# Patient Record
Sex: Male | Born: 1950 | Race: White | Hispanic: No | Marital: Married | Smoking: Never smoker
Health system: Southern US, Community
[De-identification: ages and names within clinical notes are randomized; demographics above are authoritative.]

## PROBLEM LIST (undated history)

## (undated) DIAGNOSIS — I10 Essential (primary) hypertension: Secondary | ICD-10-CM

## (undated) DIAGNOSIS — M109 Gout, unspecified: Secondary | ICD-10-CM

## (undated) DIAGNOSIS — E7800 Pure hypercholesterolemia, unspecified: Secondary | ICD-10-CM

---

## 2021-02-25 ENCOUNTER — Encounter (HOSPITAL_BASED_OUTPATIENT_CLINIC_OR_DEPARTMENT_OTHER): Payer: Self-pay | Admitting: *Deleted

## 2021-02-25 ENCOUNTER — Other Ambulatory Visit: Payer: Self-pay

## 2021-02-25 ENCOUNTER — Emergency Department (HOSPITAL_BASED_OUTPATIENT_CLINIC_OR_DEPARTMENT_OTHER): Payer: Medicare Other

## 2021-02-25 ENCOUNTER — Emergency Department (HOSPITAL_BASED_OUTPATIENT_CLINIC_OR_DEPARTMENT_OTHER)
Admission: EM | Admit: 2021-02-25 | Discharge: 2021-02-25 | Disposition: A | Discharge status: Home / Self Care | Payer: Medicare Other | Attending: Emergency Medicine | Admitting: Emergency Medicine

## 2021-02-25 DIAGNOSIS — S0990XA Unspecified injury of head, initial encounter: Secondary | ICD-10-CM

## 2021-02-25 DIAGNOSIS — Y9289 Other specified places as the place of occurrence of the external cause: Secondary | ICD-10-CM | POA: Diagnosis not present

## 2021-02-25 DIAGNOSIS — W228XXA Striking against or struck by other objects, initial encounter: Secondary | ICD-10-CM | POA: Diagnosis not present

## 2021-02-25 DIAGNOSIS — Z23 Encounter for immunization: Secondary | ICD-10-CM | POA: Diagnosis not present

## 2021-02-25 DIAGNOSIS — I10 Essential (primary) hypertension: Secondary | ICD-10-CM | POA: Insufficient documentation

## 2021-02-25 DIAGNOSIS — Y939 Activity, unspecified: Secondary | ICD-10-CM | POA: Insufficient documentation

## 2021-02-25 DIAGNOSIS — S0181XA Laceration without foreign body of other part of head, initial encounter: Secondary | ICD-10-CM | POA: Insufficient documentation

## 2021-02-25 DIAGNOSIS — Y990 Civilian activity done for income or pay: Secondary | ICD-10-CM | POA: Diagnosis not present

## 2021-02-25 HISTORY — DX: Essential (primary) hypertension: I10

## 2021-02-25 HISTORY — DX: Gout, unspecified: M10.9

## 2021-02-25 HISTORY — DX: Pure hypercholesterolemia, unspecified: E78.00

## 2021-02-25 MED ORDER — TETANUS-DIPHTH-ACELL PERTUSSIS 5-2.5-18.5 LF-MCG/0.5 IM SUSY
0.5000 mL | PREFILLED_SYRINGE | Freq: Once | INTRAMUSCULAR | Status: AC
  Administered 2021-02-25: 0.5 mL via INTRAMUSCULAR
  Filled 2021-02-25: qty 0.5

## 2021-02-25 NOTE — Discharge Instructions (Addendum)
You were seen in the emergency department today for a laceration. Your laceration was closed with skin glue. Please keep this area clean and dry for the next 24 hours, after 24 hours you may get this area wet, but avoid soaking the area. Keep the area covered as best possible especially when in the sun to help in minimizing scarring.   Your tetanus has been updated  The skin glue will come off on its own over time.  Your head CT did not show any brain bleeding.   Please follow up with primary care for general recheck in 1 week. Return to the ER soon should you start to experience pus type drainage from the wound, redness around the wound, or fevers as this could indicate the area is infected, vomiting, confusion, numbness, weakness, new or sudden change in headache, neck/back pain, seizure activity or any other concerns.

## 2021-02-25 NOTE — ED Triage Notes (Addendum)
C/o head injury x 1 hr ago hitting head on metal door, lac to forehead

## 2021-02-25 NOTE — ED Provider Notes (Signed)
[facility] EMERGENCY DEPARTMENT Provider Note   CSN: [id number] Arrival date & time: 02/25/21  1419     History Chief Complaint  Patient presents with   Head Injury    Jason Escobar is a 70 y.o. male with a hx of hypertension, hypercholesterolemia, and gout who presents to the ED for evaluation of head injury that occurred 1 hour PTA. Patient reports that while working today he was outside, turned his head and an individual was lifting their hatchback and it struck him in the head. He briefly may have blacked out for a second, but did not fall to the ground or have any additional injuries. Did have some nausea which has since resolved. Given his wound he went to the office medical staff to get a bandaid and was referred to the ED. States the area is sore, no alleviating/aggravating factors. Denies anticoagulation, visual disturbance, numbness, weakness, neck pain, back pain, or any other areas of injury. Last tetanus 2013.   HPI     Past Medical History:  Diagnosis Date   Gout    Hypercholesteremia    Hypertension     There are no problems to display for this patient.   History reviewed. No pertinent surgical history.     History reviewed. No pertinent family history.  Social History   Tobacco Use   Smoking status: Never   Smokeless tobacco: Never  Substance Use Topics   Alcohol use: Not Currently   Drug use: Not Currently    Home Medications Prior to Admission medications   Not on File    Allergies    Iodinated diagnostic agents and Penicillins  Review of Systems   Review of Systems  Constitutional:  Negative for chills and fever.  Respiratory:  Negative for shortness of breath.   Cardiovascular:  Negative for chest pain.  Gastrointestinal:  Positive for nausea (resolved). Negative for abdominal pain and vomiting.  Neurological:  Positive for headaches. Negative for seizures, weakness and numbness.  All other systems reviewed and are  negative.  Physical Exam Updated Vital Signs BP (!) 155/83 (BP Location: Right Arm)   Pulse 83   Temp 98.5 F (36.9 C) (Oral)   Resp 16   Ht 6\' 1"  (1.854 m)   Wt 113.4 kg   SpO2 94%   BMI 32.98 kg/m   Physical Exam Vitals and nursing note reviewed.  Constitutional:      General: He is not in acute distress.    Appearance: He is well-developed. He is not toxic-appearing.  HENT:     Head: Normocephalic.     Comments: 3.5 cm linear laceration to the forehead just inferior to the hairline. 1-2 mm deep. No significant SQ tissue exposure. No active bleeding. No hematoma. No racoon eyes or battle sign.     Ears:     Comments: No hemotympanum.  Eyes:     General:        Right eye: No discharge.        Left eye: No discharge.     Extraocular Movements: Extraocular movements intact.     Conjunctiva/sclera: Conjunctivae normal.     Pupils: Pupils are equal, round, and reactive to light.  Cardiovascular:     Rate and Rhythm: Normal rate and regular rhythm.  Pulmonary:     Effort: Pulmonary effort is normal. No respiratory distress.     Breath sounds: Normal breath sounds. No wheezing, rhonchi or rales.  Abdominal:     General: There is no distension.  Palpations: Abdomen is soft.     Tenderness: There is no abdominal tenderness.  Musculoskeletal:     Cervical back: Normal range of motion and neck supple. No tenderness.  Skin:    General: Skin is warm and dry.     Findings: No rash.  Neurological:     Mental Status: He is alert.     Comments: Alert. Clear speech. Cn III-XII grossly intact. Sensation grossly intact x 4. 5/5 symmetric grip strength and strength with plantar/dorsiflexion bilaterally. Ambulatory.   Psychiatric:        Behavior: Behavior normal.    ED Results / Procedures / Treatments   Labs (all labs ordered are listed, but only abnormal results are displayed) Labs Reviewed - No data to display  EKG None  Radiology CT Head Wo Contrast  Result Date:  02/25/2021 CLINICAL DATA:  Head trauma EXAM: CT HEAD WITHOUT CONTRAST TECHNIQUE: Contiguous axial images were obtained from the base of the skull through the vertex without intravenous contrast. COMPARISON:  None. FINDINGS: Brain: No acute territorial infarction, hemorrhage or intracranial mass. Mild atrophy. The ventricles are nonenlarged Vascular: No hyperdense vessel or unexpected calcification. Skull: Normal. Negative for fracture or focal lesion. Sinuses/Orbits: No acute finding. Other: None IMPRESSION: Negative non contrasted CT appearance of the brain for age Electronically Signed   By: Jasmine Pang M.D.   On: 02/25/2021 15:47    Procedures .Marland KitchenLaceration Repair  Date/Time: 02/25/2021 4:31 PM Performed by: Cherly Anderson, PA-C Authorized by: Cherly Anderson, PA-C   Consent:    Consent obtained:  Verbal   Consent given by:  Patient   Risks, benefits, and alternatives were discussed: yes     Alternatives discussed:  No treatment Anesthesia:    Anesthesia method:  None Laceration details:    Location:  Face   Face location:  Forehead   Length (cm):  3.5   Depth (mm):  1 Exploration:    Contaminated: no   Treatment:    Area cleansed with:  Povidone-iodine   Amount of cleaning:  Standard   Irrigation solution:  Sterile water Skin repair:    Repair method:  Tissue adhesive Approximation:    Approximation:  Close Repair type:    Repair type:  Simple Post-procedure details:    Procedure completion:  Tolerated well, no immediate complications   Medications Ordered in ED Medications  Tdap (BOOSTRIX) injection 0.5 mL (has no administration in time range)    ED Course  I have reviewed the triage vital signs and the nursing notes.  Pertinent labs & imaging results that were available during my care of the patient were reviewed by me and considered in my medical decision making (see chart for details).    MDM Rules/Calculators/A&P                           Patient presents to the ED for evaluation of head injury. Nontoxic, bp mildly elevated. Laceration present to forehead just inferior to the hairline. Superficial, cleansed, no visible FB, repaired per procedure note above. Tetanus updated. No midline spinal tenderness and no neuro deficits- do not suspect spinal injury at this time. Will obtain head CT given brief blacking out episode in patient > 80 yo following head injury.   Additional history obtained:  Additional history obtained from chart review & nursing note review.   Imaging Studies ordered:  I ordered imaging studies which included CT head wo contrast, I independently reviewed, formal  radiology impression shows:  Negative non contrasted CT appearance of the brain for age  ED Course:  Head CT reassuring. Laceration repaired as above. Patient appears appropriate for discharge home.   I discussed results, treatment plan, need for follow-up, and return precautions with the patient. Provided opportunity for questions, patient confirmed understanding and is in agreement with plan.   Portions of this note were generated with Scientist, clinical (histocompatibility and immunogenetics). Dictation errors may occur despite best attempts at proofreading.   Final Clinical Impression(s) / ED Diagnoses Final diagnoses:  Injury of head, initial encounter  Laceration of forehead, initial encounter    Rx / DC Orders ED Discharge Orders     None        Desmond Lope 02/25/21 1636    Tilden Fossa, MD 02/25/21 2348

## 2023-05-25 IMAGING — CT CT HEAD W/O CM
3 series · 14 of 47 positions shown, 16 images · non-contrast
Comparison: None.

CLINICAL DATA: Head trauma

EXAM:
CT HEAD WITHOUT CONTRAST
TECHNIQUE: Contiguous axial images were obtained from the base of the skull
through the vertex without intravenous contrast.

[Series 2: head wo · axial · 0.46mm/px · z∈[+1179,+1314]mm · 8 of 33 slices shown, 10 images]
[im 3/33  brain]
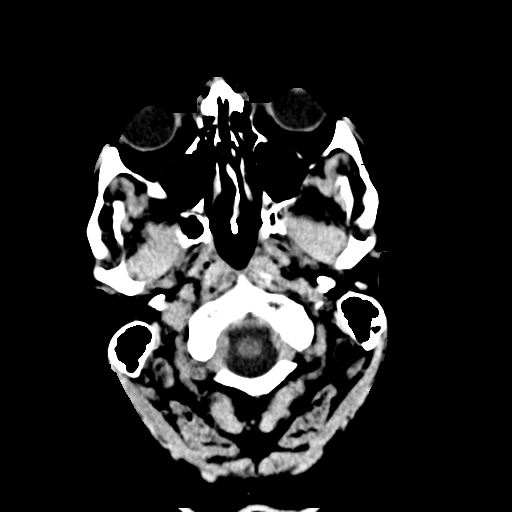
[im 3/33  bone]
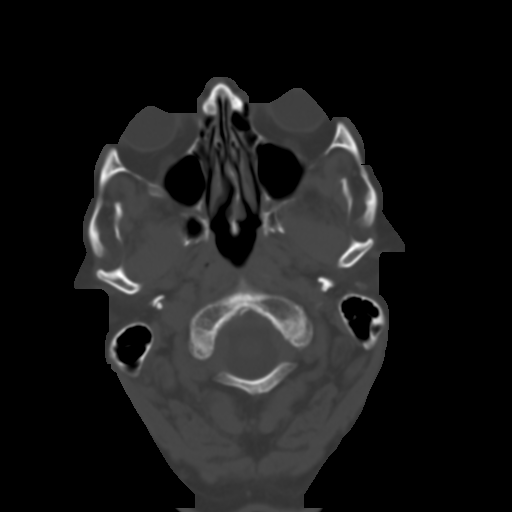
[im 7/33  brain]
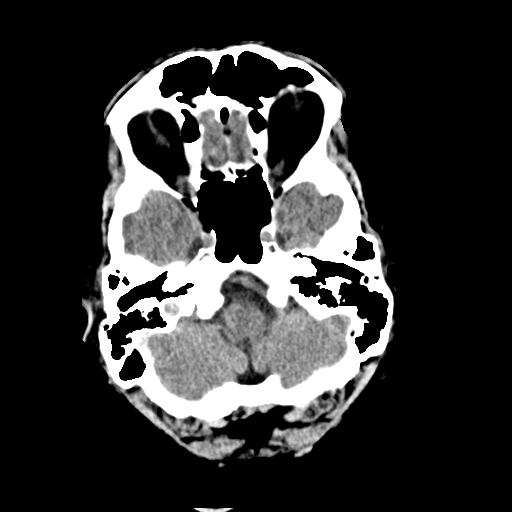
[im 10/33  brain]
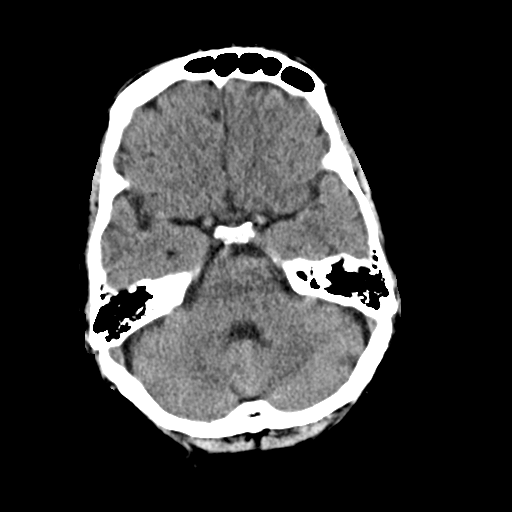
[im 15/33  brain]
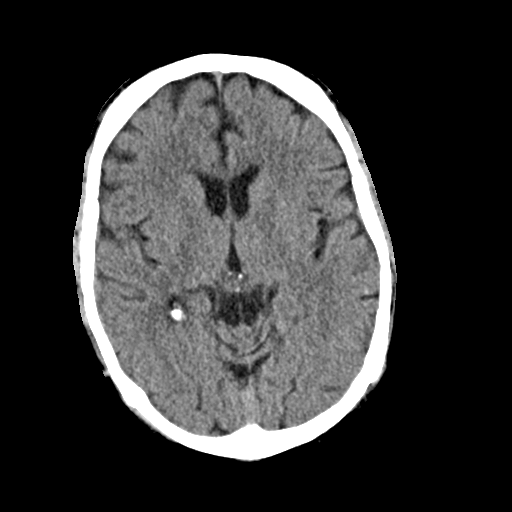
[im 18/33  brain]
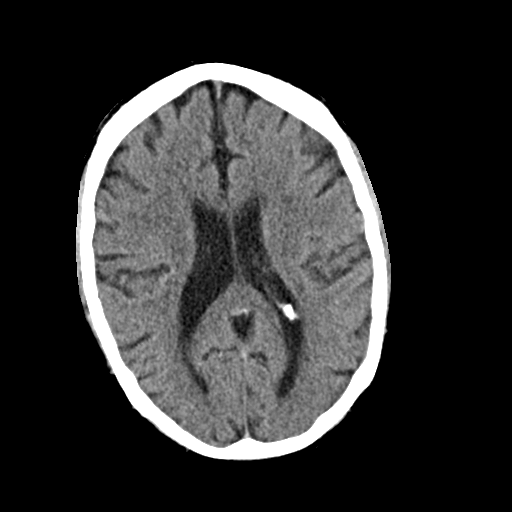
[im 18/33  bone]
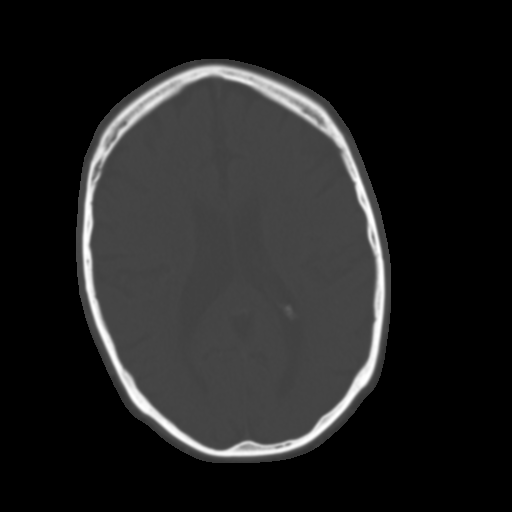
[im 23/33  brain]
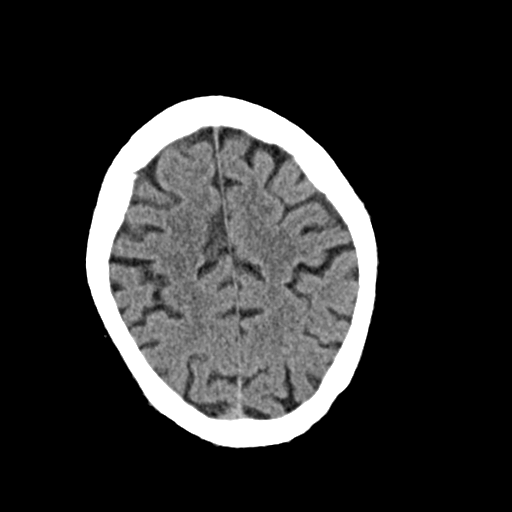
[im 26/33  brain]
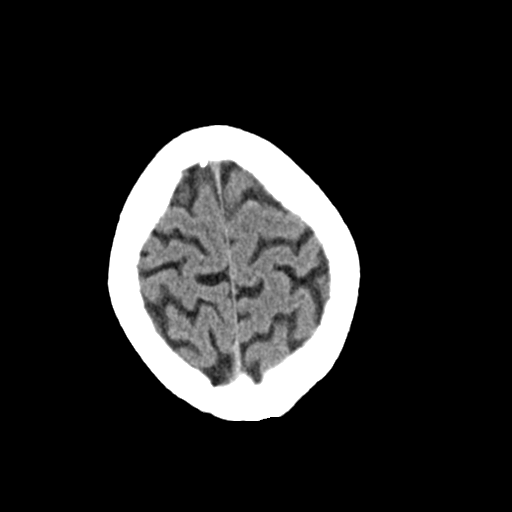
[im 30/33  brain]
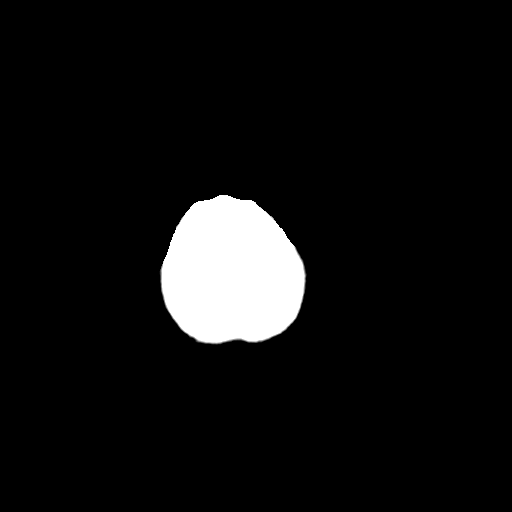

[Series 4: coronal soft · coronal · 0.32mm/px · 3 of 70 slices shown]
[im 24/70  brain]
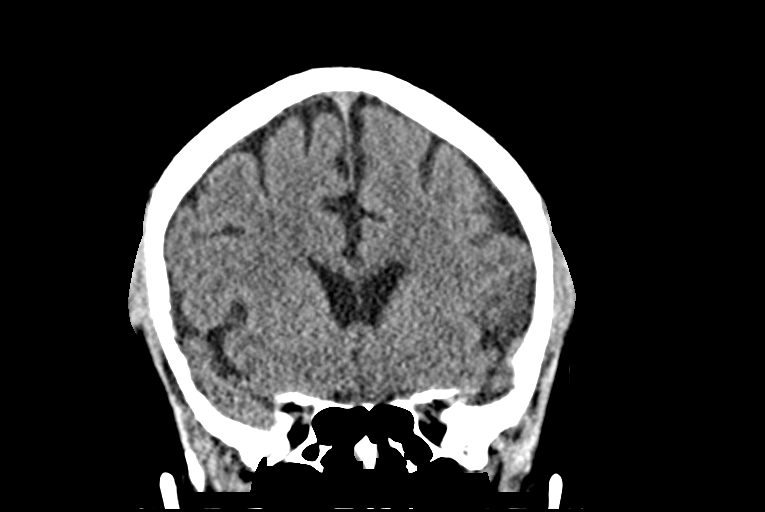
[im 31/70  brain]
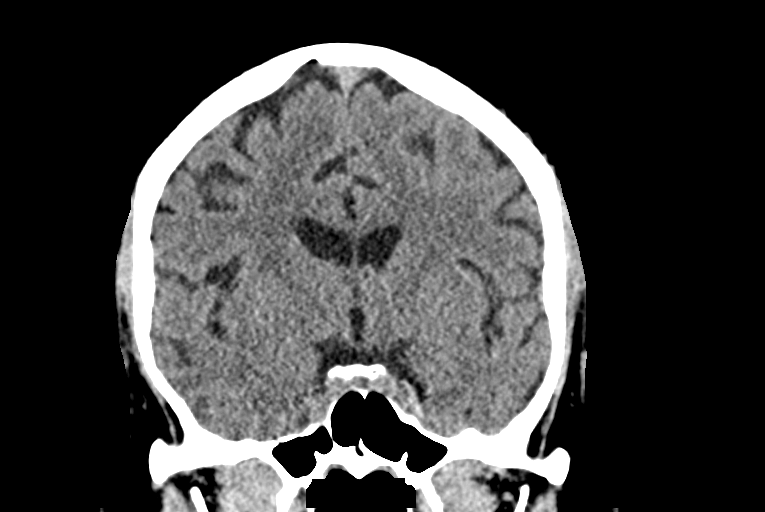
[im 39/70  brain]
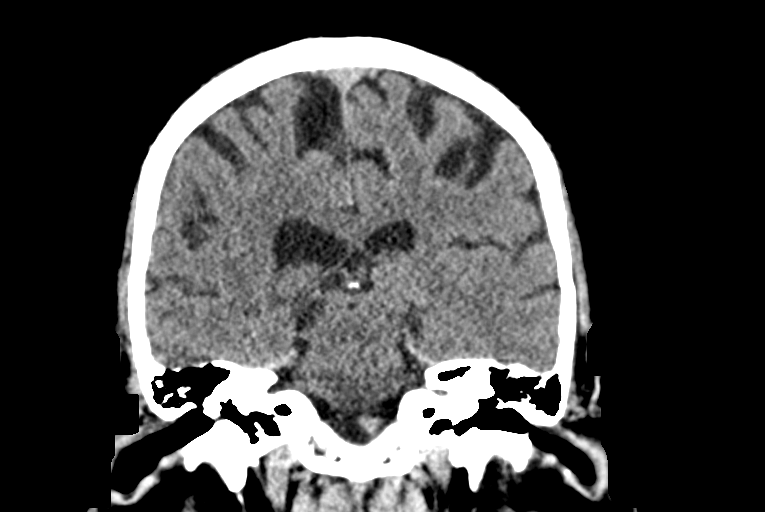

[Series 5: sag soft · sagittal · 0.32mm/px · 3 of 67 slices shown]
[im 23/67  brain]
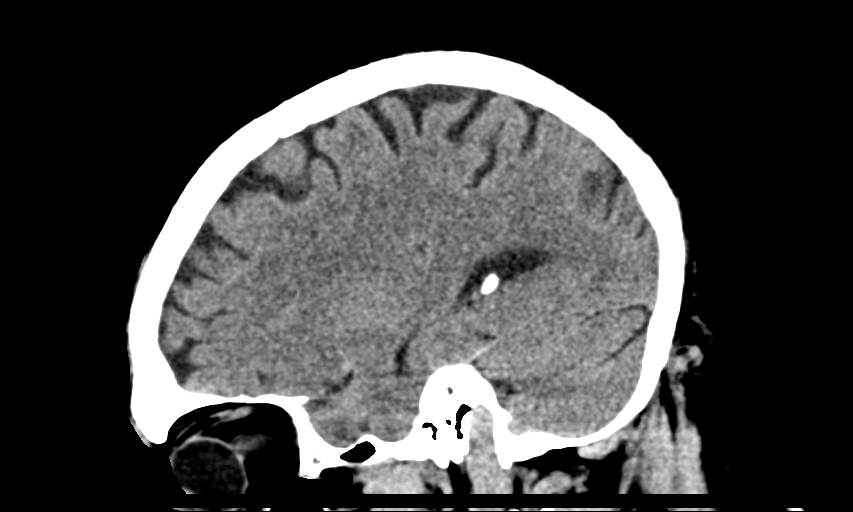
[im 34/67  brain]
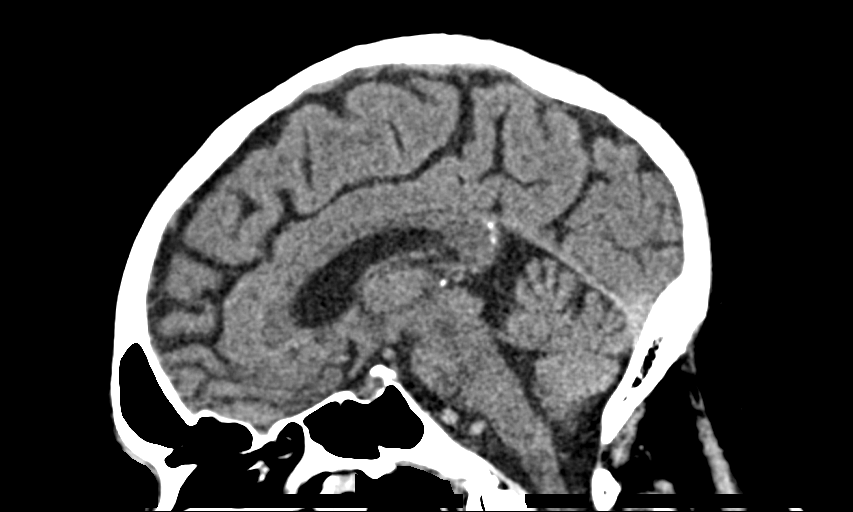
[im 45/67  brain]
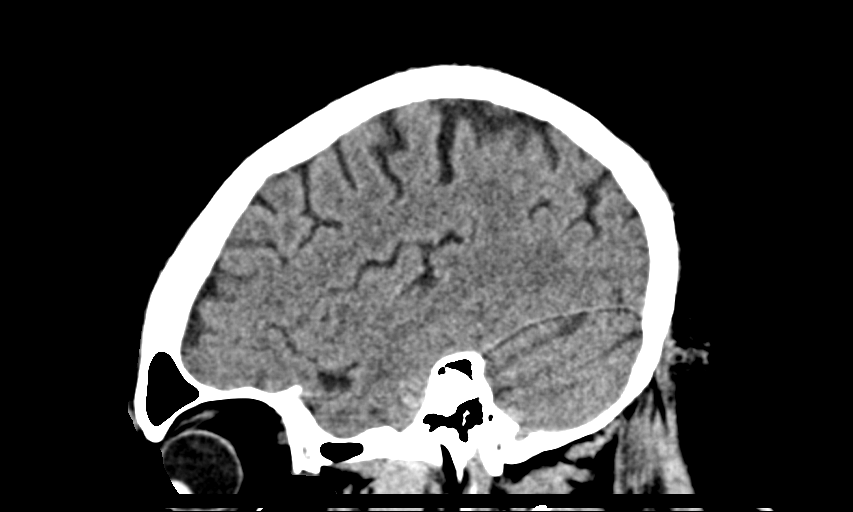

[14 of 47 positions shown; findings below may reference images not displayed]

FINDINGS: Brain: No acute territorial infarction, hemorrhage or intracranial
mass. Mild atrophy. The ventricles are nonenlarged

Vascular: No hyperdense vessel or unexpected calcification.

Skull: Normal. Negative for fracture or focal lesion.

Sinuses/Orbits: No acute finding.

Other: None
IMPRESSION: Negative non contrasted CT appearance of the brain for age

## 2023-06-01 ENCOUNTER — Other Ambulatory Visit: Payer: Self-pay

## 2023-06-01 ENCOUNTER — Inpatient Hospital Stay (HOSPITAL_BASED_OUTPATIENT_CLINIC_OR_DEPARTMENT_OTHER)
Admission: EM | Admit: 2023-06-01 | Discharge: 2023-06-03 | DRG: 243 | Disposition: A | Discharge status: Home / Self Care | Payer: Medicare Other | Attending: Cardiology | Admitting: Cardiology

## 2023-06-01 ENCOUNTER — Emergency Department (HOSPITAL_BASED_OUTPATIENT_CLINIC_OR_DEPARTMENT_OTHER): Payer: Medicare Other

## 2023-06-01 ENCOUNTER — Encounter (HOSPITAL_BASED_OUTPATIENT_CLINIC_OR_DEPARTMENT_OTHER): Payer: Self-pay

## 2023-06-01 DIAGNOSIS — K219 Gastro-esophageal reflux disease without esophagitis: Secondary | ICD-10-CM | POA: Diagnosis present

## 2023-06-01 DIAGNOSIS — Z8249 Family history of ischemic heart disease and other diseases of the circulatory system: Secondary | ICD-10-CM

## 2023-06-01 DIAGNOSIS — Z79899 Other long term (current) drug therapy: Secondary | ICD-10-CM

## 2023-06-01 DIAGNOSIS — Z880 Allergy status to penicillin: Secondary | ICD-10-CM | POA: Diagnosis not present

## 2023-06-01 DIAGNOSIS — R739 Hyperglycemia, unspecified: Secondary | ICD-10-CM | POA: Diagnosis present

## 2023-06-01 DIAGNOSIS — F32A Depression, unspecified: Secondary | ICD-10-CM | POA: Diagnosis present

## 2023-06-01 DIAGNOSIS — R001 Bradycardia, unspecified: Secondary | ICD-10-CM

## 2023-06-01 DIAGNOSIS — M109 Gout, unspecified: Secondary | ICD-10-CM | POA: Diagnosis present

## 2023-06-01 DIAGNOSIS — E7800 Pure hypercholesterolemia, unspecified: Secondary | ICD-10-CM | POA: Diagnosis present

## 2023-06-01 DIAGNOSIS — N170 Acute kidney failure with tubular necrosis: Secondary | ICD-10-CM

## 2023-06-01 DIAGNOSIS — I441 Atrioventricular block, second degree: Secondary | ICD-10-CM | POA: Diagnosis present

## 2023-06-01 DIAGNOSIS — I442 Atrioventricular block, complete: Secondary | ICD-10-CM | POA: Diagnosis not present

## 2023-06-01 DIAGNOSIS — I10 Essential (primary) hypertension: Secondary | ICD-10-CM

## 2023-06-01 DIAGNOSIS — N179 Acute kidney failure, unspecified: Secondary | ICD-10-CM | POA: Diagnosis present

## 2023-06-01 DIAGNOSIS — Z91041 Radiographic dye allergy status: Secondary | ICD-10-CM

## 2023-06-01 DIAGNOSIS — R42 Dizziness and giddiness: Secondary | ICD-10-CM | POA: Diagnosis not present

## 2023-06-01 LAB — BASIC METABOLIC PANEL
Anion gap: 13 (ref 5–15)
BUN: 26 mg/dL — ABNORMAL HIGH (ref 8–23)
CO2: 23 mmol/L (ref 22–32)
Calcium: 9.6 mg/dL (ref 8.9–10.3)
Chloride: 104 mmol/L (ref 98–111)
Creatinine, Ser: 1.32 mg/dL — ABNORMAL HIGH (ref 0.61–1.24)
GFR, Estimated: 58 mL/min — ABNORMAL LOW (ref >60)
Glucose, Bld: 159 mg/dL — ABNORMAL HIGH (ref 70–99)
Potassium: 3.7 mmol/L (ref 3.5–5.1)
Sodium: 140 mmol/L (ref 135–145)

## 2023-06-01 LAB — TROPONIN I (HIGH SENSITIVITY)
Troponin I (High Sensitivity): 7 ng/L (ref <18)
Troponin I (High Sensitivity): 7 ng/L (ref <18)

## 2023-06-01 LAB — CBC
HCT: 42.5 % (ref 39.0–52.0)
Hemoglobin: 14.3 g/dL (ref 13.0–17.0)
MCH: 29.9 pg (ref 26.0–34.0)
MCHC: 33.6 g/dL (ref 30.0–36.0)
MCV: 88.7 fL (ref 80.0–100.0)
Platelets: 196 10*3/uL (ref 150–400)
RBC: 4.79 MIL/uL (ref 4.22–5.81)
RDW: 14.1 % (ref 11.5–15.5)
WBC: 9.1 10*3/uL (ref 4.0–10.5)
nRBC: 0 % (ref 0.0–0.2)

## 2023-06-01 LAB — SURGICAL PCR SCREEN
MRSA, PCR: NEGATIVE
Staphylococcus aureus: POSITIVE — AB

## 2023-06-01 MED ORDER — HEPARIN SODIUM (PORCINE) 5000 UNIT/ML IJ SOLN
5000.0000 [IU] | Freq: Three times a day (TID) | INTRAMUSCULAR | Status: DC
  Administered 2023-06-01 – 2023-06-02 (×2): 5000 [IU] via SUBCUTANEOUS
  Filled 2023-06-01 (×2): qty 1

## 2023-06-01 MED ORDER — ACETAMINOPHEN 325 MG PO TABS
650.0000 mg | ORAL_TABLET | ORAL | Status: DC | PRN
  Administered 2023-06-02 – 2023-06-03 (×2): 650 mg via ORAL
  Filled 2023-06-01 (×2): qty 2

## 2023-06-01 MED ORDER — ROSUVASTATIN CALCIUM 20 MG PO TABS
20.0000 mg | ORAL_TABLET | Freq: Every evening | ORAL | Status: DC
  Administered 2023-06-01 – 2023-06-02 (×2): 20 mg via ORAL
  Filled 2023-06-01 (×2): qty 1

## 2023-06-01 MED ORDER — CHLORHEXIDINE GLUCONATE CLOTH 2 % EX PADS
6.0000 | MEDICATED_PAD | Freq: Every day | CUTANEOUS | Status: DC
  Administered 2023-06-02 – 2023-06-03 (×2): 6 via TOPICAL

## 2023-06-01 MED ORDER — ONDANSETRON HCL 4 MG/2ML IJ SOLN: 4.0000 mg | Freq: Four times a day (QID) | INTRAMUSCULAR | Status: DC | PRN

## 2023-06-01 MED ORDER — MUPIROCIN 2 % EX OINT
1.0000 | TOPICAL_OINTMENT | Freq: Two times a day (BID) | CUTANEOUS | Status: DC
  Administered 2023-06-01 – 2023-06-03 (×4): 1 via NASAL
  Filled 2023-06-01 (×2): qty 22

## 2023-06-01 MED ORDER — ESCITALOPRAM OXALATE 10 MG PO TABS
10.0000 mg | ORAL_TABLET | Freq: Every day | ORAL | Status: DC
  Administered 2023-06-02 – 2023-06-03 (×2): 10 mg via ORAL
  Filled 2023-06-01 (×2): qty 1

## 2023-06-01 NOTE — ED Triage Notes (Signed)
C/o lightheadedness/weakness, worse with exertion starting Monday. Went to PCP and had abnormal EKG. Denies chest pain/SOB

## 2023-06-01 NOTE — ED Notes (Signed)
Consult to cardiology called for, spoke with Maisie Fus at [facility]

## 2023-06-01 NOTE — H&P (Signed)
Cardiology Admission History and Physical   Patient ID: Jason Escobar MRN: [id number]; DOB: 1950-09-07   Admission date: 06/01/2023  PCP:  Jason Escobar., MD   [facility] Providers Cardiologist:  New  Chief Complaint:  Dizziness/2:1 AVB  Patient Profile:   Jason Escobar is a 72 y.o. male with hypertension, hyperlipidemia, GERD, depression who is being seen 06/01/2023 for the evaluation of dizziness, heart block.  History of Present Illness:   Jason Escobar is a 72 year old male with past medical history noted above. He has not been seen by cardiology in the past, follows with PCP on a regular basis. 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 He and his wife are currently building a home in [place]. Reports he has been in his usual state of health until Monday. Reports he was outside working in the yard and felt weak, dizzy. Noted he was unable to do his usual activity. Decided to get on his tractor and was able to complete the yard work. Continued to feel weak and fatigued into Tuesday but has evening began to feel more "like himself". This morning he was up getting ready for work, putting on his uniform and gun belt and he felt light-headed. Became concerned and presented to his PCP office. At the office he was noted to 2nd degree AVB and sent to the ED for further evaluation.   In the ED his labs showed Na+ 140, K+ 3.7, Cr 1.32, hsTn 7>>7, WBC 9.1, Hgb 14.3. CXR negative. EKG appears to be more consistent with CHB, 40 bpm. Case was discussed with cardiology and transferred to [facility] for further evaluation.    On arrival, he is alert and oriented. BP are stable. He is not on any AV nodal agents prior to admission.   Past Medical History:  Diagnosis Date   Gout    Hypercholesteremia    Hypertension     History reviewed. No pertinent surgical history.   Medications Prior to Admission: Prior to Admission medications    Medication Sig Start Date End Date Taking? Authorizing Provider  allopurinol (ZYLOPRIM) 300 MG tablet Take 300 mg by mouth daily. 12/18/14  Yes [provider]  aspirin EC 81 MG tablet Take 81 mg by mouth at bedtime. 01/04/17  Yes [provider]  Blood Glucose Monitoring Suppl (TGT BLOOD GLUCOSE MONITORING) w/Device KIT Use to check blood sugar once a day  DX E11.69 10/03/18  Yes [provider]  cetirizine (ZYRTEC) 10 MG tablet Take 10 mg by mouth at bedtime.   Yes [provider]  Cholecalciferol (VITAMIN D3) 20 MCG (800 UNIT) TABS Take 20 mcg by mouth at bedtime.   Yes [provider]  cyanocobalamin (VITAMIN B12) 1000 MCG tablet Take 1,000 mcg by mouth every evening. 10/06/15  Yes [provider]  escitalopram (LEXAPRO) 10 MG tablet Take 10 mg by mouth daily. 11/16/21  Yes [provider]  fluticasone (FLONASE) 50 MCG/ACT nasal spray Place 2 sprays into both nostrils as needed for allergies. 05/21/19  Yes [provider]  glucosamine-chondroitin 500-400 MG tablet Take 1 tablet by mouth 2 (two) times daily.   Yes [provider]  lisinopril-hydrochlorothiazide (ZESTORETIC) 10-12.5 MG tablet Take 1 tablet by mouth daily. 12/25/14  Yes [provider]  Multiple Vitamins-Minerals (ZINC PO) Take 1 tablet by mouth daily.   Yes [provider]  Omega-3 1000 MG CAPS Take 1 capsule by mouth 2 (two) times  daily.   Yes [provider]  omeprazole (PRILOSEC) 20 MG capsule Take 20 mg by mouth daily. 05/15/23  Yes [provider]  rosuvastatin (CRESTOR) 20 MG tablet Take 20 mg by mouth every evening. 02/11/22  Yes [provider]     Allergies:    Allergies  Allergen Reactions   Iodinated Contrast Media Hives and Rash   Penicillins Hives    Social History:   Social History   Socioeconomic History   Marital status: Married    Spouse name: Not on file   Number of children: Not on  file   Years of education: Not on file   Highest education level: Not on file  Occupational History   Not on file  Tobacco Use   Smoking status: Never   Smokeless tobacco: Never  Vaping Use   Vaping status: Never Used  Substance and Sexual Activity   Alcohol use: Not Currently   Drug use: Not Currently   Sexual activity: Not on file  Other Topics Concern   Not on file  Social History Narrative   Not on file   Social Determinants of Health   Financial Resource Strain: Not on file  Food Insecurity: Low Risk  (05/31/2023)   Received from [facility]   Hunger Vital Sign    Worried About Running Out of Food in the Last Year: Never true    Ran Out of Food in the Last Year: Never true  Transportation Needs: No Transportation Needs (05/31/2023)   Received from [facility]    In the past 12 months, has lack of reliable transportation kept you from medical appointments, meetings, work or from getting things needed for daily living? : No  Physical Activity: Not on file  Stress: Not on file  Social Connections: Not on file  Intimate Partner Violence: Not on file    Family History:   The patient's family history includes Hypertension in his father.    ROS:  Please see the history of present illness.  All other ROS reviewed and negative.     Physical Exam/Data:   Vitals:   06/01/23 1345 06/01/23 1400 06/01/23 1415 06/01/23 1700  BP:  (!) 146/70  (!) 155/74  Pulse: (!) 40 (!) 38 (!) 39 (!) 43  Resp: 18 12 14 14   Temp:    97.6 F (36.4 C)  TempSrc:    Oral  SpO2: 96% 98% 100% 97%  Weight:      Height:       No intake or output data in the 24 hours ending 06/01/23 1801    06/01/2023   12:32 PM 02/25/2021    2:26 PM  Last 3 Weights  Weight (lbs) 243 lb 250 lb  Weight (kg) 110.224 kg 113.399 kg     Body mass index is 32.06 kg/m.  General:  Well nourished, well developed, in no acute distress HEENT: normal Neck: no JVD Vascular: No carotid  bruits; Distal pulses 2+ bilaterally   Cardiac:  normal S1, S2; RRR; no murmur  Lungs:  clear to auscultation bilaterally, no wheezing, rhonchi or rales  Abd: soft, nontender, no hepatomegaly  Ext: no edema Musculoskeletal:  No deformities, BUE and BLE strength normal and equal Skin: warm and dry  Neuro:  CNs 2-12 intact, no focal abnormalities noted Psych:  Normal affect    EKG:  The ECG that was done 10/23 was personally reviewed and demonstrates high degree AVB 40 bpm  Relevant CV Studies:  N/a   Laboratory Data:  High Sensitivity Troponin:   Recent Labs  Lab 06/01/23 1236 06/01/23 1436  TROPONINIHS 7 7      Chemistry Recent Labs  Lab 06/01/23 1236  NA 140  K 3.7  CL 104  CO2 23  GLUCOSE 159*  BUN 26*  CREATININE 1.32*  CALCIUM 9.6  GFRNONAA 58*  ANIONGAP 13    No results for input(s): "PROT", "ALBUMIN", "AST", "ALT", "ALKPHOS", "BILITOT" in the last 168 hours. Lipids No results for input(s): "CHOL", "TRIG", "HDL", "LABVLDL", "LDLCALC", "CHOLHDL" in the last 168 hours. Hematology Recent Labs  Lab 06/01/23 1236  WBC 9.1  RBC 4.79  HGB 14.3  HCT 42.5  MCV 88.7  MCH 29.9  MCHC 33.6  RDW 14.1  PLT 196   Thyroid No results for input(s): "TSH", "FREET4" in the last 168 hours. BNPNo results for input(s): "BNP", "PROBNP" in the last 168 hours.  DDimer No results for input(s): "DDIMER" in the last 168 hours.   Radiology/Studies:  DG Chest Port 1 View  Result Date: 06/01/2023 CLINICAL DATA:  Weakness and lightheadedness. EXAM: PORTABLE CHEST 1 VIEW COMPARISON:  02/08/2020 FINDINGS: Stable heart size and mediastinal contours. Chronic elevation of right hemidiaphragm. No focal airspace disease, pulmonary edema, large pleural effusion or pneumothorax. Multiple overlying monitoring devices. IMPRESSION: No active disease. Electronically Signed   By: Narda Rutherford M.D.   On: 06/01/2023 15:37     Assessment and Plan:   Ona Sedgwick is a 72 y.o. male with  hypertension, hyperlipidemia, GERD, depression who is being seen 06/01/2023 for the evaluation of dizziness, heart block.  High degree AVB -- he developed symptoms on Monday that lingering into this morning when he presented to his PCP office. EKG there was reported a 2:1 AVB. EKG in the ED appears more consistent with CHB. Bp is stable, and he is asymptomatic at the present. No AVN agents pta -- check echo -- NPO at midnight in anticipation of PPM placement  HTN -- BP stable, will hold home meds for now  AKI -- Cr 1.32, as above holding BP meds -- recheck BMET in am  Code Status: Full Code  Severity of Illness: The appropriate patient status for this patient is INPATIENT. Inpatient status is judged to be reasonable and necessary in order to provide the required intensity of service to ensure the patient's safety. The patient's presenting symptoms, physical exam findings, and initial radiographic and laboratory data in the context of their chronic comorbidities is felt to place them at high risk for further clinical deterioration. Furthermore, it is not anticipated that the patient will be medically stable for discharge from the hospital within 2 midnights of admission.   * I certify that at the point of admission it is my clinical judgment that the patient will require inpatient hospital care spanning beyond 2 midnights from the point of admission due to high intensity of service, high risk for further deterioration and high frequency of surveillance required.*   For questions or updates, please contact [facility] Please consult [email or web address] for contact info under     Signed, Jason Page, NP  06/01/2023 6:01 PM

## 2023-06-01 NOTE — Plan of Care (Signed)

## 2023-06-01 NOTE — ED Provider Notes (Signed)
[place] [facility] AT [facility] [place] Provider Note   CSN: [id number] Arrival date & time: 06/01/23  1225     History  Chief Complaint  Patient presents with   Dizziness    Jason Escobar is a 72 y.o. male.  72 year old male with a history of hypertension and hyperlipidemia who presents to the emergency department with dizziness.  Patient reports that over the past 3 days has been lightheaded and very fatigued with exertion.  Has not actually lost consciousness.  No chest pain at all.  No history of arrhythmia.  Went to his primary doctor and was found to be in a 2-1 heart block and was referred to the emergency department.  Says that he does not have any known history of arrhythmia aside from a right bundle branch block.       Home Medications Prior to Admission medications   Medication Sig Start Date End Date Taking? Authorizing Provider  allopurinol (ZYLOPRIM) 300 MG tablet Take 300 mg by mouth daily. 12/18/14  Yes [provider]  aspirin EC 81 MG tablet Take 81 mg by mouth at bedtime. 01/04/17  Yes [provider]  Blood Glucose Monitoring Suppl (TGT BLOOD GLUCOSE MONITORING) w/Device KIT Use to check blood sugar once a day  DX E11.69 10/03/18  Yes [provider]  cetirizine (ZYRTEC) 10 MG tablet Take 10 mg by mouth at bedtime.   Yes [provider]  Cholecalciferol (VITAMIN D3) 20 MCG (800 UNIT) TABS Take 20 mcg by mouth at bedtime.   Yes [provider]  cyanocobalamin (VITAMIN B12) 1000 MCG tablet Take 1,000 mcg by mouth every evening. 10/06/15  Yes [provider]  escitalopram (LEXAPRO) 10 MG tablet Take 10 mg by mouth daily. 11/16/21  Yes [provider]  fluticasone (FLONASE) 50 MCG/ACT nasal spray Place 2 sprays into both nostrils as needed for allergies. 05/21/19  Yes [provider]  glucosamine-chondroitin 500-400 MG tablet Take 1 tablet by mouth 2 (two) times daily.   Yes  [provider]  lisinopril-hydrochlorothiazide (ZESTORETIC) 10-12.5 MG tablet Take 1 tablet by mouth daily. 12/25/14  Yes [provider]  Multiple Vitamins-Minerals (ZINC PO) Take 1 tablet by mouth daily.   Yes [provider]  Omega-3 1000 MG CAPS Take 1 capsule by mouth 2 (two) times daily.   Yes [provider]  omeprazole (PRILOSEC) 20 MG capsule Take 20 mg by mouth daily. 05/15/23  Yes [provider]  rosuvastatin (CRESTOR) 20 MG tablet Take 20 mg by mouth every evening. 02/11/22  Yes [provider]      Allergies    Iodinated contrast media and Penicillins    Review of Systems   Review of Systems  Physical Exam Updated Vital Signs BP (!) 146/70   Pulse (!) 39   Temp 98.2 F (36.8 C) (Oral)   Resp 14   Ht 6\' 1"  (1.854 m)   Wt 110.2 kg   SpO2 100%   BMI 32.06 kg/m  Physical Exam Vitals and nursing note reviewed.  Constitutional:      General: He is not in acute distress.    Appearance: He is well-developed.  HENT:     Head: Normocephalic and atraumatic.     Right Ear: External ear normal.     Left Ear: External ear normal.     Nose: Nose normal.  Eyes:     Extraocular Movements: Extraocular movements intact.     Conjunctiva/sclera: Conjunctivae normal.  Pupils: Pupils are equal, round, and reactive to light.  Cardiovascular:     Rate and Rhythm: Regular rhythm. Bradycardia present.     Heart sounds: Normal heart sounds.  Pulmonary:     Effort: Pulmonary effort is normal. No respiratory distress.  Musculoskeletal:     Cervical back: Normal range of motion and neck supple.     Right lower leg: Edema (Trace) present.     Left lower leg: Edema (Trace) present.  Skin:    General: Skin is warm and dry.  Neurological:     Mental Status: He is alert. Mental status is at baseline.  Psychiatric:        Mood and Affect: Mood normal.        Behavior: Behavior normal.     ED Results / Procedures / Treatments    Labs (all labs ordered are listed, but only abnormal results are displayed) Labs Reviewed  BASIC METABOLIC PANEL - Abnormal; Notable for the following components:      Result Value   Glucose, Bld 159 (*)    BUN 26 (*)    Creatinine, Ser 1.32 (*)    GFR, Estimated 58 (*)    All other components within normal limits  CBC  TROPONIN I (HIGH SENSITIVITY)  TROPONIN I (HIGH SENSITIVITY)    EKG EKG Interpretation Date/Time:  Wednesday June 01 2023 12:35:36 EDT Ventricular Rate:  40 PR Interval:    QRS Duration:  151 QT Interval:  603 QTC Calculation: 492 R Axis:   111  Text Interpretation: 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  Procedures Procedures    Medications Ordered in ED Medications - No data to display  ED Course/ Medical Decision Making/ A&P Clinical Course as of 06/01/23 1436  Wed Jun 01, 2023  1355 Dr Jacinto Halim from cardiology to admit the patient for pacemaker evaluation. [RP]    Clinical Course User Index [RP] Rondel Baton, MD                                 Medical Decision Making Amount and/or Complexity of Data Reviewed Labs: ordered. Radiology: ordered.  Risk Decision regarding hospitalization.   Jason Escobar is a 72 y.o. male with comorbidities that complicate the patient evaluation including hypertension and hyperlipidemia who presents emergency department with dizziness  Initial Ddx:  Arrhythmia, complete heart block, MI, anemia  MDM/Course:  Patient resents emergency department with generalized weakness and lightheadedness for the past 3 days.  Had an EKG prior to arrival that shows that he was in the 2-1 heart block.  Here does appear to be in complete heart block.  He is hemodynamically stable otherwise and mentating well so no indication for emergent transcutaneous pacing.  Had troponin that was WNL.  EKG without  ischemic changes.  Discussed with cardiology who will admit the patient for pacemaker evaluation.  Upon re-evaluation patient remained stable.  This patient presents to the ED for concern of complaints listed in HPI, this involves an extensive number of treatment options, and is a complaint that carries with it a high risk of complications and morbidity. Disposition including potential need for admission considered.   Dispo: Admit to Floor  Additional history obtained from spouse Records reviewed Outpatient Clinic Notes The following labs were independently interpreted: Chemistry and show  kidney injury  of unclear chronicity I independently reviewed the following imaging with scope of interpretation limited to determining acute life threatening conditions related to emergency care: Chest x-ray and agree with the radiologist interpretation with the following exceptions: none I personally reviewed and interpreted cardiac monitoring:  Complete heart block I personally reviewed and interpreted the pt's EKG: see above for interpretation  I have reviewed the patients home medications and made adjustments as needed Consults: Cardiology Social Determinants of health:  Elderly  Portions of this note were generated with Scientist, clinical (histocompatibility and immunogenetics). Dictation errors may occur despite best attempts at proofreading.    CRITICAL CARE Performed by: Rondel Baton   Total critical care time: 45 minutes  Critical care time was exclusive of separately billable procedures and treating other patients.  Critical care was necessary to treat or prevent imminent or life-threatening deterioration.  Critical care was time spent personally by me on the following activities: development of treatment plan with patient and/or surrogate as well as nursing, discussions with consultants, evaluation of patient's response to treatment, examination of patient, obtaining history from patient or surrogate, ordering and  performing treatments and interventions, ordering and review of laboratory studies, ordering and review of radiographic studies, pulse oximetry and re-evaluation of patient's condition.  Final Clinical Impression(s) / ED Diagnoses Final diagnoses:  Complete heart block (HCC)  Bradycardia  Lightheadedness    Rx / DC Orders ED Discharge Orders     None         Rondel Baton, MD 06/01/23 1436

## 2023-06-02 ENCOUNTER — Other Ambulatory Visit: Payer: Self-pay

## 2023-06-02 ENCOUNTER — Inpatient Hospital Stay (HOSPITAL_COMMUNITY)
Admission: EM | Disposition: A | Discharge status: Home / Self Care | Payer: Self-pay | Source: Home / Self Care | Attending: Cardiology

## 2023-06-02 ENCOUNTER — Inpatient Hospital Stay (HOSPITAL_COMMUNITY): Payer: Medicare Other

## 2023-06-02 DIAGNOSIS — I441 Atrioventricular block, second degree: Secondary | ICD-10-CM

## 2023-06-02 DIAGNOSIS — N170 Acute kidney failure with tubular necrosis: Secondary | ICD-10-CM

## 2023-06-02 HISTORY — PX: PACEMAKER IMPLANT: EP1218

## 2023-06-02 LAB — BASIC METABOLIC PANEL
Anion gap: 9 (ref 5–15)
BUN: 22 mg/dL (ref 8–23)
CO2: 23 mmol/L (ref 22–32)
Calcium: 9.2 mg/dL (ref 8.9–10.3)
Chloride: 107 mmol/L (ref 98–111)
Creatinine, Ser: 1.4 mg/dL — ABNORMAL HIGH (ref 0.61–1.24)
GFR, Estimated: 54 mL/min — ABNORMAL LOW (ref >60)
Glucose, Bld: 127 mg/dL — ABNORMAL HIGH (ref 70–99)
Potassium: 3.6 mmol/L (ref 3.5–5.1)
Sodium: 139 mmol/L (ref 135–145)

## 2023-06-02 LAB — CBC
HCT: 37.8 % — ABNORMAL LOW (ref 39.0–52.0)
Hemoglobin: 12.6 g/dL — ABNORMAL LOW (ref 13.0–17.0)
MCH: 29 pg (ref 26.0–34.0)
MCHC: 33.3 g/dL (ref 30.0–36.0)
MCV: 87.1 fL (ref 80.0–100.0)
Platelets: 176 10*3/uL (ref 150–400)
RBC: 4.34 MIL/uL (ref 4.22–5.81)
RDW: 14 % (ref 11.5–15.5)
WBC: 9.1 10*3/uL (ref 4.0–10.5)
nRBC: 0 % (ref 0.0–0.2)

## 2023-06-02 LAB — ECHOCARDIOGRAM COMPLETE
AR max vel: 3.15 cm2
AV Area VTI: 2.94 cm2
AV Area mean vel: 3.04 cm2
AV Mean grad: 7 mm[Hg]
AV Peak grad: 13.8 mm[Hg]
Ao pk vel: 1.86 m/s
Area-P 1/2: 5.84 cm2
Height: 73 in
S' Lateral: 2 cm
Weight: 3969.6 [oz_av]

## 2023-06-02 LAB — HIV ANTIBODY (ROUTINE TESTING W REFLEX): HIV Screen 4th Generation wRfx: NONREACTIVE

## 2023-06-02 SURGERY — PACEMAKER IMPLANT
Anesthesia: LOCAL

## 2023-06-02 MED ORDER — FENTANYL CITRATE (PF) 100 MCG/2ML IJ SOLN
INTRAMUSCULAR | Status: DC | PRN
  Administered 2023-06-02 (×2): 25 ug via INTRAVENOUS

## 2023-06-02 MED ORDER — VANCOMYCIN HCL 1500 MG/300ML IV SOLN
1500.0000 mg | INTRAVENOUS | Status: AC
  Administered 2023-06-02: 1500 mg via INTRAVENOUS

## 2023-06-02 MED ORDER — CEFAZOLIN SODIUM-DEXTROSE 2-4 GM/100ML-% IV SOLN: 2.0000 g | INTRAVENOUS | Status: DC

## 2023-06-02 MED ORDER — LIDOCAINE HCL (PF) 1 % IJ SOLN
INTRAMUSCULAR | Status: DC | PRN
  Administered 2023-06-02: 60 mL

## 2023-06-02 MED ORDER — FENTANYL CITRATE (PF) 100 MCG/2ML IJ SOLN
INTRAMUSCULAR | Status: AC
  Filled 2023-06-02: qty 2

## 2023-06-02 MED ORDER — VANCOMYCIN HCL IN DEXTROSE 1-5 GM/200ML-% IV SOLN
INTRAVENOUS | Status: AC
  Filled 2023-06-02: qty 200

## 2023-06-02 MED ORDER — SODIUM CHLORIDE 0.9 % IV SOLN: INTRAVENOUS | Status: DC

## 2023-06-02 MED ORDER — HEPARIN (PORCINE) IN NACL 1000-0.9 UT/500ML-% IV SOLN
INTRAVENOUS | Status: DC | PRN
  Administered 2023-06-02: 500 mL

## 2023-06-02 MED ORDER — SODIUM CHLORIDE 0.9% FLUSH: 3.0000 mL | Freq: Two times a day (BID) | INTRAVENOUS | Status: DC

## 2023-06-02 MED ORDER — CHLORHEXIDINE GLUCONATE 4 % EX SOLN
60.0000 mL | Freq: Once | CUTANEOUS | Status: AC
  Administered 2023-06-02: 4 via TOPICAL

## 2023-06-02 MED ORDER — SODIUM CHLORIDE 0.9 % IV SOLN
INTRAVENOUS | Status: AC
  Administered 2023-06-02: 80 mg
  Filled 2023-06-02: qty 2

## 2023-06-02 MED ORDER — VANCOMYCIN HCL IN DEXTROSE 1-5 GM/200ML-% IV SOLN
1000.0000 mg | Freq: Two times a day (BID) | INTRAVENOUS | Status: AC
  Administered 2023-06-03: 1000 mg via INTRAVENOUS
  Filled 2023-06-02: qty 200

## 2023-06-02 MED ORDER — SODIUM CHLORIDE 0.9% FLUSH: 3.0000 mL | INTRAVENOUS | Status: DC | PRN

## 2023-06-02 MED ORDER — MIDAZOLAM HCL 2 MG/2ML IJ SOLN
INTRAMUSCULAR | Status: AC
  Filled 2023-06-02: qty 2

## 2023-06-02 MED ORDER — SODIUM CHLORIDE 0.9 % IV SOLN
80.0000 mg | INTRAVENOUS | Status: AC
  Filled 2023-06-02: qty 2

## 2023-06-02 MED ORDER — SODIUM CHLORIDE 0.9 % IV SOLN: 250.0000 mL | INTRAVENOUS | Status: DC

## 2023-06-02 MED ORDER — CHLORHEXIDINE GLUCONATE 4 % EX SOLN
CUTANEOUS | Status: AC
  Filled 2023-06-02: qty 60

## 2023-06-02 MED ORDER — LIDOCAINE HCL (PF) 1 % IJ SOLN
INTRAMUSCULAR | Status: AC
Start: 2023-06-02 — End: ?
  Filled 2023-06-02: qty 60

## 2023-06-02 MED ORDER — MIDAZOLAM HCL 5 MG/5ML IJ SOLN
INTRAMUSCULAR | Status: DC | PRN
  Administered 2023-06-02 (×2): 1 mg via INTRAVENOUS

## 2023-06-02 SURGICAL SUPPLY — 13 items
CABLE SURGICAL S-101-97-12 (CABLE) ×1 IMPLANT
CATH RIGHTSITE C315HIS02 (CATHETERS) IMPLANT
IPG PACE AZUR XT DR MRI W1DR01 (Pacemaker) IMPLANT
LEAD CAPSURE NOVUS 5076-52CM (Lead) IMPLANT
LEAD SELECT SECURE 3830 383069 (Lead) IMPLANT
PACE AZURE XT DR MRI W1DR01 (Pacemaker) ×1 IMPLANT
PAD DEFIB RADIO PHYSIO CONN (PAD) ×1 IMPLANT
SELECT SECURE 3830 383069 (Lead) ×1 IMPLANT
SHEATH 7FR PRELUDE SNAP 13 (SHEATH) IMPLANT
SHEATH PROBE COVER 6X72 (BAG) IMPLANT
SLITTER 6232ADJ (MISCELLANEOUS) IMPLANT
TRAY PACEMAKER INSERTION (PACKS) ×1 IMPLANT
WIRE HI TORQ VERSACORE-J 145CM (WIRE) IMPLANT

## 2023-06-02 NOTE — Progress Notes (Signed)
Echocardiogram 2D Echocardiogram has been performed.  Jason Escobar 06/02/2023, 1:06 PM

## 2023-06-02 NOTE — TOC Initial Note (Signed)
Transition of Care [facility]) - Initial/Assessment Note    Patient Details  Name: Jason Escobar MRN: [id number] Date of Birth: [birth year]  [facility]) CM/SW Contact:    Leone Haven, RN Phone Number: 06/02/2023, 11:50 AM  Clinical Narrative:                 From home with spouse, has PCP and insurance on file, states has no HH services in place at this time or DME at home.  States wife will transport him home at [employer] and family is support system, states gets medications from [facility] on [address] in [place].  Pta self ambulatory.  Expected Discharge Plan: Home/Self Care Barriers to Discharge: Continued Medical Work up   Patient Goals and CMS Choice Patient states their goals for this hospitalization and ongoing recovery are:: return home   Choice offered to / list presented to : NA      Expected Discharge Plan and Services In-house Referral: NA Discharge Planning Services: CM Consult Post Acute Care Choice: NA Living arrangements for the past 2 months: Single Family Home                 DME Arranged: N/A DME Agency: NA       HH Arranged: NA          Prior Living Arrangements/Services Living arrangements for the past 2 months: Single Family Home Lives with:: Spouse Patient language and need for interpreter reviewed:: Yes Do you feel safe going back to the place where you live?: Yes      Need for Family Participation in Patient Care: Yes (Comment) Care giver support system in place?: Yes (comment)   Criminal Activity/Legal Involvement Pertinent to Current Situation/Hospitalization: No - Comment as needed  Activities of Daily Living   ADL Screening (condition at time of admission) Independently performs ADLs?: Yes (appropriate for developmental age) Is the patient deaf or have difficulty hearing?: No Does the patient have difficulty seeing, even when wearing glasses/contacts?: No Does the patient have difficulty concentrating, remembering,  or making decisions?: No  Permission Sought/Granted Permission sought to share information with : Case Manager Permission granted to share information with : Yes, Verbal Permission Granted              Emotional Assessment Appearance:: Appears stated age Attitude/Demeanor/Rapport: Engaged Affect (typically observed): Appropriate Orientation: : Oriented to Self, Oriented to Place, Oriented to  Time, Oriented to Situation Alcohol / Substance Use: Not Applicable Psych Involvement: No (comment)  Admission diagnosis:  Complete heart block (HCC) [I44.2] Lightheadedness [R42] Bradycardia [R00.1] Second degree heart block [I44.1] Patient Active Problem List   Diagnosis Date Noted   Acute renal failure with tubular necrosis (HCC) 06/02/2023   Complete heart block (HCC) 06/01/2023   Second degree heart block 06/01/2023   Bradycardia 06/01/2023   Lightheadedness 06/01/2023   Primary hypertension 06/01/2023   Pure hypercholesterolemia 06/01/2023   PCP:  Elijio Miles., MD Pharmacy:   [facility] [phone] - [place], [place] - [address] AT [facility] [address] [place]  [address] Phone: [phone] Fax: [phone]     Social Determinants of Health (SDOH) Social History: SDOH Screenings   Food Insecurity: No Food Insecurity (06/02/2023)  Housing: Low Risk  (06/02/2023)  Transportation Needs: No Transportation Needs (06/02/2023)  Utilities: Not At Risk (06/02/2023)  Tobacco Use: Low Risk  (06/01/2023)   SDOH Interventions:     Readmission Risk Interventions  No data to display

## 2023-06-02 NOTE — Consult Note (Addendum)
ELECTROPHYSIOLOGY CONSULT NOTE    Patient ID: Jason Escobar MRN: [id number], DOB/AGE: 70-Jan-1952 70 y.o.  Admit date: 06/01/2023 Date of Consult: 06/02/2023  Primary Physician: Elijio Miles., MD Primary Cardiologist: None  Electrophysiologist: New   Referring Provider: Dr. Jacinto Halim  Patient Profile: Jason Escobar is a 72 y.o. male with a history of HTN, HLD, GERD, and depression who is being seen today for the evaluation of CHB at the request of Dr. Jacinto Halim.  HPI:  Jason Escobar is a 72 year old male with past medical history noted above. He has not been seen by cardiology in the past, follows with PCP on a regular basis. 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 He and his wife are currently building a home in [place]. Reports he has been in his usual state of health until Monday. Reports he was outside working in the yard and felt weak, dizzy. Noted he was unable to do his usual activity. Decided to get on his tractor and was able to complete the yard work. Continued to feel weak and fatigued into Tuesday but has evening began to feel more "like himself". This morning he was up getting ready for work, putting on his uniform and gun belt and he felt light-headed. Became concerned and presented to his PCP office. At the office he was noted to 2nd degree AVB and sent to the ED for further evaluation.    In the ED his labs showed Na+ 140, K+ 3.7, Cr 1.32, hsTn 7>>7, WBC 9.1, Hgb 14.3. CXR negative. EKG appears to be more consistent with CHB, 40 bpm. Case was discussed with cardiology and transferred to [facility] for further evaluation.     Cardiology admitted and has been stable overnight. EP asked to see to consider pacing.   Pt feeling OK at rest this am. Denies SOB. He has had lightheadedness and fatigue, worse with any activity.  Has been mild for a couple of months, but discretely worse starting on Monday. Denies history of chest pain or  syncope. Never smoker. Willing to proceed with PPM pending further work up.   Labs Potassium3.6 (10/24 0217)   Creatinine, ser  1.40* (10/24 0217) PLT  176 (10/24 0217) HGB  12.6* (10/24 0217) WBC 9.1 (10/24 0217) Troponin I (High Sensitivity)7 (10/23 1436).    Past Medical History:  Diagnosis Date   Gout    Hypercholesteremia    Hypertension      Surgical History: History reviewed. No pertinent surgical history.   Medications Prior to Admission  Medication Sig Dispense Refill Last Dose   allopurinol (ZYLOPRIM) 300 MG tablet Take 300 mg by mouth daily.   06/01/2023   aspirin EC 81 MG tablet Take 81 mg by mouth at bedtime.   05/31/2023   Blood Glucose Monitoring Suppl (TGT BLOOD GLUCOSE MONITORING) w/Device KIT Use to check blood sugar once a day  DX E11.69      cetirizine (ZYRTEC) 10 MG tablet Take 10 mg by mouth at bedtime.   05/31/2023   Cholecalciferol (VITAMIN D3) 20 MCG (800 UNIT) TABS Take 20 mcg by mouth at bedtime.   06/01/2023   cyanocobalamin (VITAMIN B12) 1000 MCG tablet Take 1,000 mcg by mouth every evening.   05/31/2023   escitalopram (LEXAPRO) 10 MG tablet Take 10 mg by mouth daily.   06/01/2023   fluticasone (FLONASE) 50 MCG/ACT nasal spray Place 2 sprays into both nostrils as needed for allergies.   unknown  glucosamine-chondroitin 500-400 MG tablet Take 1 tablet by mouth 2 (two) times daily.   06/01/2023   lisinopril-hydrochlorothiazide (ZESTORETIC) 10-12.5 MG tablet Take 1 tablet by mouth daily.   06/01/2023   Multiple Vitamins-Minerals (ZINC PO) Take 1 tablet by mouth daily.   06/01/2023   Omega-3 1000 MG CAPS Take 1 capsule by mouth 2 (two) times daily.   06/01/2023   omeprazole (PRILOSEC) 20 MG capsule Take 20 mg by mouth daily.   06/01/2023   rosuvastatin (CRESTOR) 20 MG tablet Take 20 mg by mouth every evening.   05/31/2023    Inpatient Medications:   Chlorhexidine Gluconate Cloth  6 each Topical Q0600   escitalopram  10 mg Oral Daily   mupirocin  ointment  1 Application Nasal BID   rosuvastatin  20 mg Oral QPM    Allergies:  Allergies  Allergen Reactions   Iodinated Contrast Media Hives and Rash   Penicillins Hives    Family History  Problem Relation Age of Onset   Hypertension Father      Physical Exam: Vitals:   06/01/23 2200 06/01/23 2327 06/02/23 0338 06/02/23 0735  BP: (!) 144/62 116/66 118/68   Pulse: (!) 42 (!) 37 (!) 35   Resp: 16 14 14    Temp:  98.4 F (36.9 C) 98.3 F (36.8 C) 97.7 F (36.5 C)  TempSrc:  Oral Oral Oral  SpO2: 91% 94% 92%   Weight:      Height:        GEN- NAD, A&O x 3, normal affect HEENT: Normocephalic, atraumatic Lungs- CTAB, Normal effort.  Heart- Regular rate and rhythm, No M/G/R.  GI- Soft, NT, ND.  Extremities- No clubbing, cyanosis, or edema   Radiology/Studies: DG Chest Port 1 View  Result Date: 06/01/2023 CLINICAL DATA:  Weakness and lightheadedness. EXAM: PORTABLE CHEST 1 VIEW COMPARISON:  02/08/2020 FINDINGS: Stable heart size and mediastinal contours. Chronic elevation of right hemidiaphragm. No focal airspace disease, pulmonary edema, large pleural effusion or pneumothorax. Multiple overlying monitoring devices. IMPRESSION: No active disease. Electronically Signed   By: Narda Rutherford M.D.   On: 06/01/2023 15:37    EKG: on arrival shows CHB with wide escape at ~ 40 bpm (personally reviewed)  TELEMETRY: 2:1 heart block vs CHB with isorhythmic AV dissociation HR 30-40s (personally reviewed)  Assessment/Plan:  Advanced AV block Intermittent CHB Echo today shows LVEF 65-70%, mild LVH, mild LAE, No significant valvular disease.  No AV nodal agents on board.  No ischemic symptoms, No prior EKGs for comparison.  Hemodynamically.  Explained risks, benefits, and alternatives to PPM implantation, including but not limited to bleeding, infection, pneumothorax, pericardial effusion, lead dislodgement, heart attack, stroke, or death.  Pt verbalized understanding and agrees  to proceed pending further work up to look for underlying causes.    HTN Avoid AV nodal agents  AKI Cr 1.3 on arrival, follow.  BP meds held, likely in setting of heart block   For questions or updates, please contact [facility] Please consult [email or web address] for contact info under Cardiology/STEMI.  Signed, Graciella Freer, PA-C  06/02/2023 9:27 AM   I have seen and examined this patient with Otilio Saber.  Agree with above, note added to reflect my findings.  Patient presented to the hospital with weakness, fatigue, shortness of breath with exertion.  EKG showed second-degree AV block.  He is on no AV nodal blockers.  He currently feels well while at rest, and only has symptoms with exertion.  He had an echo that  showed a normal ejection fraction.  GEN: Well nourished, well developed, in no acute distress  HEENT: normal  Neck: no JVD, carotid bruits, or masses Cardiac: bradycardic; no murmurs, rubs, or gallops,no edema  Respiratory:  clear to auscultation bilaterally, normal work of breathing GI: soft, nontender, nondistended, + BS MS: no deformity or atrophy  Skin: warm and dry Neuro:  Strength and sensation are intact Psych: euthymic mood, full affect   Second-degree AV block: No reversible causes noted.  Ejection fraction is normal.  He would benefit from pacemaker implant.  Risk and benefits have been discussed.  Risk of bleeding, tamponade, infection, pneumothorax, lead dislodgment, MI, renal failure, death, stroke.  He understands these risks and is agreed to the procedure.  Dareth Andrew M. Anapaola Kinsel MD 06/02/2023 2:26 PM

## 2023-06-02 NOTE — Progress Notes (Signed)
Back from the cath lab by bed awake and alert. Instructed not to turn to left site, affected site. Arm sling in use to left arm.

## 2023-06-02 NOTE — Progress Notes (Signed)
Patient Name: Jason Escobar Date of Encounter: 06/02/2023 [facility] Cardiologist: Yates Decamp, MD 06/01/2023 Length of stay: 1  Interval Summary  .    Jason Escobar is a 72 y.o. with hypertension, hyperlipidemia, GERD, admitted to the hospital with complete heart block when he presented with generalized weakness, inability to do his physical activity.  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  This morning he has no specific complaints.  Physical Exam    Vitals:   06/01/23 2000 06/01/23 2200 06/01/23 2327 06/02/23 0338  BP: (!) 128/59 (!) 144/62 116/66 118/68  Pulse: (!) 41 (!) 42 (!) 37 (!) 35  Resp: 14 16 14 14   Temp: 98.8 F (37.1 C)  98.4 F (36.9 C) 98.3 F (36.8 C)  TempSrc: Oral  Oral Oral  SpO2: 92% 91% 94% 92%  Weight:      Height:       Physical Exam Neck:     Vascular: No JVD.  Cardiovascular:     Rate and Rhythm: Regular rhythm. Bradycardia present.     Pulses: Intact distal pulses.     Heart sounds: S1 normal and S2 normal. Murmur heard.     Early systolic murmur is present with a grade of 2/6 at the upper right sternal border.     No gallop.  Pulmonary:     Effort: Pulmonary effort is normal.     Breath sounds: Normal breath sounds.  Abdominal:     General: Bowel sounds are normal.     Palpations: Abdomen is soft.  Musculoskeletal:     Right lower leg: No edema.     Left lower leg: No edema.     Intake/Output Summary (Last 24 hours) at 06/02/2023 0718 Last data filed at 06/02/2023 0339 Gross per 24 hour  Intake 480 ml  Output 600 ml  Net -120 ml      06/01/2023    5:00 PM 06/01/2023   12:32 PM 02/25/2021    2:26 PM  Last 3 Weights  Weight (lbs) 248 lb 1.6 oz 243 lb 250 lb  Weight (kg) 112.537 kg 110.224 kg 113.399 kg      Tele/EKG/Cardiac studies    Telemetry 06/02/23 complete heart block with 2: 1 conduction, ventricular rate between 35 and 45 bpm - Personally Reviewed EKG:  Complete heart block  with junctional escape, right bundle branch block.  No results found for this or any previous visit (from the past 08657 hour(s)).   Radiology  Chest x-ray single view 06/01/2023: Stable heart size and mediastinal contours. Chronic elevation of right hemidiaphragm. No focal airspace disease, pulmonary edema, large pleural effusion or pneumothorax. Multiple overlying monitoring devices.  Labs   Lab Results  Component Value Date   NA 139 06/02/2023   K 3.6 06/02/2023   CO2 23 06/02/2023   GLUCOSE 127 (H) 06/02/2023   BUN 22 06/02/2023   CREATININE 1.40 (H) 06/02/2023   CALCIUM 9.2 06/02/2023   GFRNONAA 54 (L) 06/02/2023       Latest Ref Rng & Units 06/02/2023    2:17 AM 06/01/2023   12:36 PM  BMP  Glucose 70 - 99 mg/dL 846  962   BUN 8 - 23 mg/dL 22  26   Creatinine 9.52 - 1.24 mg/dL 8.41  3.24   Sodium 401 - 145 mmol/L 139  140   Potassium 3.5 - 5.1 mmol/L 3.6  3.7   Chloride 98 - 111 mmol/L 107  104  CO2 22 - 32 mmol/L 23  23   Calcium 8.9 - 10.3 mg/dL 9.2  9.6        Latest Ref Rng & Units 06/02/2023    2:17 AM 06/01/2023   12:36 PM  CBC  WBC 4.0 - 10.5 K/uL 9.1  9.1   Hemoglobin 13.0 - 17.0 g/dL 95.6  21.3   Hematocrit 39.0 - 52.0 % 37.8  42.5   Platelets 150 - 400 K/uL 176  196    External Labs: Lipid profile 08/11/2022:  Total cholesterol 146, triglycerides 111, HDL 37, LDL 84.  A1c 6.2%.  BUN 21, creatinine 1.12, EGFR 71 mL.  Labs 01/07/2022:  TSH 1.10.    Cardiac Panel (last 3 results) Recent Labs    06/01/23 1236 06/01/23 1436  TROPONINIHS 7 7     Intake/Output Summary (Last 24 hours) at 06/02/2023 0718 Last data filed at 06/02/2023 0339 Gross per 24 hour  Intake 480 ml  Output 600 ml  Net -120 ml    Net IO Since Admission: -120 mL [06/02/23 0718]  Assessment & Plan .     1.  Complete heart block, symptomatic, patient not on any negative inotropic agents. Will obtain echocardiogram to evaluate any structural heart disease, if  there is no wall motion abnormality and LVEF is normal, he will go for permanent pacemaker implantation otherwise may need cardiac evaluation from coronary artery disease standpoint.  My suspicion for CAD is low in view of very mild hypertension, hyperglycemia and normal lipids with treatment.  2.  Acute renal failure, renal function in January 2024 by external review was normal, suspect a component of dehydration and low cardiac output due to complete heart block to be the etiology and suspect this should correct.  3.  Primary hypertension, patient was on lisinopril HCT prior to the hospitalization.  Presently on hold although not listed on prior to admission orders.  Will continue to hold this for now as blood pressure is normal and continue to follow the serum creatinine.  4.  Hypercholesteremia Patient is presently appropriately treated with Crestor 20 mg daily, continue the same.  5.  Hyperglycemia Patient would benefit from being on an ACE inhibitor however his serum creatinine is up from baseline hence we will hold off on any ACE inhibitor therapy, he is not on any beta blocking agents as well.  Probably best option is to base him and discharged him with outpatient follow-up of serum creatinine and follow-up on hypertension.  All questions answered.  For questions or updates, please contact [facility] Please consult [email or web address] for contact info under        Signed, Yates Decamp, MD, [facility] 06/02/2023, 7:18 AM [facility] [address] [place], [place] [address] Phone: [phone]. Fax:  [phone]  Cell: [phone]

## 2023-06-02 NOTE — Progress Notes (Signed)
Mobility Specialist Progress Note:   06/02/23 1000  Mobility  Activity Ambulated with assistance in hallway  Level of Assistance Standby assist, set-up cues, supervision of patient - no hands on  Assistive Device Other (Comment) (IV Pole)  Distance Ambulated (ft) 250 ft  Activity Response Tolerated well  Mobility Referral Yes  $Mobility charge 1 Mobility  Mobility Specialist Start Time (ACUTE ONLY) 1021  Mobility Specialist Stop Time (ACUTE ONLY) 1028  Mobility Specialist Time Calculation (min) (ACUTE ONLY) 7 min    Pre Mobility: 35 HR,  90% SpO2 During Mobility: 47 HR, 94% SpO2 Post Mobility:  38 HR,  90% SpO2  Pt received in bed, agreeable to mobility. Asymptomatic w/ no complaints throughout session. Pt left in bed with call bell and all needs met. Family present.  D'Vante Earlene Plater Mobility Specialist Please contact via Special educational needs teacher or Rehab office at [phone]

## 2023-06-02 NOTE — Progress Notes (Signed)
Transported to the cath lab for PM implant, awake and  alert.

## 2023-06-03 ENCOUNTER — Encounter (HOSPITAL_COMMUNITY): Payer: Self-pay | Admitting: Cardiology

## 2023-06-03 ENCOUNTER — Inpatient Hospital Stay (HOSPITAL_COMMUNITY): Payer: Medicare Other

## 2023-06-03 DIAGNOSIS — I441 Atrioventricular block, second degree: Secondary | ICD-10-CM | POA: Diagnosis not present

## 2023-06-03 LAB — BASIC METABOLIC PANEL
Anion gap: 7 (ref 5–15)
BUN: 18 mg/dL (ref 8–23)
CO2: 24 mmol/L (ref 22–32)
Calcium: 8.9 mg/dL (ref 8.9–10.3)
Chloride: 107 mmol/L (ref 98–111)
Creatinine, Ser: 1.17 mg/dL (ref 0.61–1.24)
GFR, Estimated: >60 mL/min (ref >60)
Glucose, Bld: 95 mg/dL (ref 70–99)
Potassium: 3.7 mmol/L (ref 3.5–5.1)
Sodium: 138 mmol/L (ref 135–145)

## 2023-06-03 MED ORDER — ACETAMINOPHEN 325 MG PO TABS: 650.0000 mg | ORAL_TABLET | ORAL | Status: AC | PRN

## 2023-06-03 MED ORDER — ZOLPIDEM TARTRATE 5 MG PO TABS
5.0000 mg | ORAL_TABLET | Freq: Every evening | ORAL | Status: DC | PRN
  Administered 2023-06-03: 5 mg via ORAL
  Filled 2023-06-03: qty 1

## 2023-06-03 NOTE — Discharge Instructions (Signed)
After Your Pacemaker   You have a Medtronic Pacemaker  ACTIVITY Do not lift your arm above shoulder height for 1 week after your procedure. After 7 days, you may progress as below.  You should remove your sling 24 hours after your procedure, unless otherwise instructed by your provider.     Friday June 10, 2023  Saturday June 11, 2023 Sunday June 12, 2023 Monday June 13, 2023   Do not lift, push, pull, or carry anything over 10 pounds with the affected arm until 6 weeks (Friday July 15, 2023 ) after your procedure.   You may drive AFTER your wound check, unless you have been told otherwise by your provider.   Ask your healthcare provider when you can go back to work   INCISION/Dressing If you are on a blood thinner such as Coumadin, Xarelto, Eliquis, Plavix, or Pradaxa please confirm with your provider when this should be resumed.   If large square, outer bandage is left in place, this can be removed after 24 hours from your procedure. Do not remove steri-strips or glue as below.   If a PRESSURE DRESSING (a bulky dressing that usually goes up over your shoulder) was applied or left in place, please follow instructions given by your provider on when to return to have this removed.   Monitor your Pacemaker site for redness, swelling, and drainage. Call the device clinic at [phone] if you experience these symptoms or fever/chills.  If your incision is sealed with Steri-strips or staples, you may shower 7 days after your procedure or when told by your provider. Do not remove the steri-strips or let the shower hit directly on your site. You may wash around your site with soap and water.    If you were discharged in a sling, please do not wear this during the day more than 48 hours after your surgery unless otherwise instructed. This may increase the risk of stiffness and soreness in your shoulder.   Avoid lotions, ointments, or perfumes over your incision until it is  well-healed.  You may use a hot tub or a pool AFTER your wound check appointment if the incision is completely closed.  Pacemaker Alerts:  Some alerts are vibratory and others beep. These are NOT emergencies. Please call our office to let us know. If this occurs at night or on weekends, it can wait until the next business day. Send a remote transmission.  If your device is capable of reading fluid status (for heart failure), you will be offered monthly monitoring to review this with you.   DEVICE MANAGEMENT Remote monitoring is used to monitor your pacemaker from home. This monitoring is scheduled every 91 days by our office. It allows [place] to keep an eye on the functioning of your device to ensure it is working properly. You will routinely see your Electrophysiologist annually (more often if necessary).   You should receive your ID card for your new device in 4-8 weeks. Keep this card with you at all times once received. Consider wearing a medical alert bracelet or necklace.  Your Pacemaker may be MRI compatible. This will be discussed at your next office visit/wound check.  You should avoid contact with strong electric or magnetic fields.   Do not use amateur (ham) radio equipment or electric (arc) welding torches. MP3 player headphones with magnets should not be used. Some devices are safe to use if held at least 12 inches (30 cm) from your Pacemaker. These include power tools, lawn  mowers, and speakers. If you are unsure if something is safe to use, ask your health care provider.  When using your cell phone, hold it to the ear that is on the opposite side from the Pacemaker. Do not leave your cell phone in a pocket over the Pacemaker.  You may safely use electric blankets, heating pads, computers, and microwave ovens.  Call the office right away if: You have chest pain. You feel more short of breath than you have felt before. You feel more light-headed than you have felt before. Your  incision starts to open up.  This information is not intended to replace advice given to you by your health care provider. Make sure you discuss any questions you have with your health care provider.

## 2023-06-03 NOTE — Discharge Summary (Addendum)
ELECTROPHYSIOLOGY PROCEDURE DISCHARGE SUMMARY    Patient ID: Jason Escobar,  MRN: [id number], DOB/AGE: 1950-11-05 72 y.o.  Admit date: 06/01/2023 Discharge date: 06/03/2023  Primary Care Physician: Elijio Miles., MD  Primary Cardiologist: None  Electrophysiologist: Dr. 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  Implantation of a Medtronic Dual Chamber PPM on 10/24 by Dr. Elberta Fortis. The patient received a Medtronic Azure XT DR [id number]  with a Medtronic CapSureFix Novus 5076 right atrial lead and a Medtronic SelectSure 3830 right ventricular lead.  There were no immediate post procedure complications.   2.  CXR on 06/03/2023 demonstrated no pneumothorax status post device implantation.       Brief HPI: Jason Escobar is a 72 y.o. male was admitted for symptomatic bradycardia and electrophysiology team asked to see for consideration of PPM implantation.  Past medical history includes above.  The patient has had AV block without reversible causes identified.  Risks, benefits, and alternatives to PPM implantation were reviewed with the patient who wished to proceed.   Hospital Course:  The patient was admitted and underwent implantation of a Medtronic dual chamber PPM with details as outlined above.  He was monitored on telemetry overnight which demonstrated appropriate pacing.  Left chest was without hematoma or ecchymosis.  The device was interrogated and found to be functioning normally.  CXR was obtained and demonstrated no pneumothorax status post device implantation.  Wound care, arm mobility, and restrictions were reviewed with the patient.  The patient was examined and considered stable for discharge to home.    Anticoagulation  resumption This patient is not on anticoagulation     Physical Exam: Vitals:   06/02/23 1948 06/02/23 2348 06/03/23 0221 06/03/23 0730  BP: (!) 151/81 (!) 147/86 (!) 140/89   Pulse: 67 68 60 67  Resp: 17 20 15    Temp: 98.1 F (36.7 C) 98 F (36.7 C) 97.9 F (36.6 C)   TempSrc: Oral Oral Oral   SpO2: 94% 92% 91% 91%  Weight:      Height:        GEN- NAD. A&O x 3.  HEENT: Normocephalic, atraumatic Lungs- CTAB, Normal effort.  Heart- RRR, No M/G/R.  GI- Soft, NT, ND.  Extremities- No clubbing, cyanosis, or edema;  Skin- warm and dry, no rash or lesion, left chest without hematoma/ecchymosis  Discharge Medications:  Allergies as of 06/03/2023       Reactions   Iodinated Contrast Media Hives, Rash   Penicillins Hives        Medication List     STOP taking these medications    aspirin EC 81 MG tablet       TAKE these medications    acetaminophen 325 MG tablet Commonly known as: TYLENOL Take 2 tablets (650 mg total) by mouth every 4 (four) hours as needed for headache or mild pain (pain score 1-3).   allopurinol 300 MG tablet Commonly known as: ZYLOPRIM Take 300 mg by mouth daily.   cetirizine 10 MG tablet Commonly known as: ZYRTEC Take 10 mg by mouth at bedtime.   cyanocobalamin 1000 MCG tablet Commonly known as: VITAMIN B12 Take 1,000 mcg by mouth every evening.   escitalopram 10 MG tablet Commonly known as: LEXAPRO Take 10  mg by mouth daily.   fluticasone 50 MCG/ACT nasal spray Commonly known as: FLONASE Place 2 sprays into both nostrils as needed for allergies.   glucosamine-chondroitin 500-400 MG tablet Take 1 tablet by mouth 2 (two) times daily.   lisinopril-hydrochlorothiazide 10-12.5 MG tablet Commonly known as: ZESTORETIC Take 1 tablet by mouth daily.   Omega-3 1000 MG Caps Take 1 capsule by mouth 2 (two) times daily.   omeprazole 20 MG capsule Commonly known as: PRILOSEC Take 20 mg by mouth daily.   rosuvastatin 20 MG  tablet Commonly known as: CRESTOR Take 20 mg by mouth every evening.   TGT Blood Glucose Monitoring w/Device Kit Use to check blood sugar once a day  DX E11.69   Vitamin D3 20 MCG (800 UNIT) Tabs Take 20 mcg by mouth at bedtime.   ZINC PO Take 1 tablet by mouth daily.        Disposition:  Home with usual follow up as in AVS   Duration of Discharge Encounter: Greater than 30 minutes including physician time.  Signed, Graciella Freer, PA-C  06/03/2023 8:14 AM  I have seen and examined this patient with Otilio Saber.  Agree with above, note added to reflect my findings.  On exam, RRR, no murmurs, lungs clear.  He is now status post Medtronic pacemaker for heart block.  Device functioning appropriately.  Sensing, threshold, impedance within normal limits post procedure.  Chest x-ray and interrogation without issue.  Plan for discharge today with follow-up in device clinic.  Zohal Reny M. Khrystyna Schwalm MD 06/03/2023 8:15 AM

## 2023-06-06 MED FILL — Midazolam HCl Inj 2 MG/2ML (Base Equivalent): INTRAMUSCULAR | Qty: 2 | Status: AC

## 2023-06-13 NOTE — Patient Instructions (Signed)
   After Your Pacemaker   Monitor your pacemaker site for redness, swelling, and drainage. Call the device clinic at [phone] if you experience these symptoms or fever/chills.  Your incision was closed with Steri-strips or staples:  You may shower 7 days after your procedure and wash your incision with soap and water. Avoid lotions, ointments, or perfumes over your incision until it is well-healed.  You may use a hot tub or a pool after your wound check appointment if the incision is completely closed.  Do not lift, push or pull greater than 10 pounds with the affected arm until 6 weeks after your procedure. UNTIL AFTER DECEMBER 5TH. There are no other restrictions in arm movement after your wound check appointment.  You may drive, unless driving has been restricted by your healthcare providers.  Remote monitoring is used to monitor your pacemaker from home. This monitoring is scheduled every 91 days by our office. It allows [place] to keep an eye on the functioning of your device to ensure it is working properly. You will routinely see your Electrophysiologist annually (more often if necessary).

## 2023-06-15 ENCOUNTER — Ambulatory Visit: Payer: Medicare Other | Attending: Cardiology

## 2023-06-15 DIAGNOSIS — I442 Atrioventricular block, complete: Secondary | ICD-10-CM

## 2023-06-22 LAB — CUP PACEART INCLINIC DEVICE CHECK
Date Time Interrogation Session: 20241106140126
Implantable Lead Connection Status: 753985
Implantable Lead Connection Status: 753985
Implantable Lead Implant Date: 20241024
Implantable Lead Implant Date: 20241024
Implantable Lead Location: 753859
Implantable Lead Location: 753860
Implantable Lead Model: 3830
Implantable Lead Model: 5076
Implantable Pulse Generator Implant Date: 20241024

## 2023-06-22 NOTE — Progress Notes (Signed)

## 2023-06-27 ENCOUNTER — Telehealth: Payer: Self-pay | Admitting: Cardiology

## 2023-06-27 NOTE — Telephone Encounter (Signed)
Pt recently had pacemaker put in. He is sick with congestion and wants to make sure that he can take other medications or does he need to be careful as to what he takes. Please Advise/

## 2023-06-27 NOTE — Telephone Encounter (Signed)
Pt aware ok to take whatever OTC he needs to for his congestion. Patient verbalized understanding and agreeable to plan.

## 2023-09-05 ENCOUNTER — Encounter: Payer: Self-pay | Admitting: Cardiology

## 2023-09-05 ENCOUNTER — Ambulatory Visit: Payer: Medicare Other | Attending: Cardiology | Admitting: Cardiology

## 2023-09-05 VITALS — BP 128/86 | HR 71 | Ht 73.6 in | Wt 255.0 lb

## 2023-09-05 DIAGNOSIS — I442 Atrioventricular block, complete: Secondary | ICD-10-CM

## 2023-09-05 LAB — CUP PACEART INCLINIC DEVICE CHECK
Battery Remaining Longevity: 153 mo
Battery Voltage: 3.2 V
Brady Statistic AP VP Percent: 18.24 %
Brady Statistic AP VS Percent: 0 %
Brady Statistic AS VP Percent: 81.74 %
Brady Statistic AS VS Percent: 0.02 %
Brady Statistic RA Percent Paced: 18.21 %
Brady Statistic RV Percent Paced: 99.98 %
Date Time Interrogation Session: 20250127125059
Implantable Lead Connection Status: 753985
Implantable Lead Connection Status: 753985
Implantable Lead Implant Date: 20241024
Implantable Lead Implant Date: 20241024
Implantable Lead Location: 753859
Implantable Lead Location: 753860
Implantable Lead Model: 3830
Implantable Lead Model: 5076
Implantable Pulse Generator Implant Date: 20241024
Lead Channel Impedance Value: 285 Ohm
Lead Channel Impedance Value: 475 Ohm
Lead Channel Impedance Value: 494 Ohm
Lead Channel Impedance Value: 665 Ohm
Lead Channel Pacing Threshold Amplitude: 0.75 V
Lead Channel Pacing Threshold Amplitude: 1.25 V
Lead Channel Pacing Threshold Pulse Width: 0.4 ms
Lead Channel Pacing Threshold Pulse Width: 0.4 ms
Lead Channel Sensing Intrinsic Amplitude: 2.75 mV
Lead Channel Sensing Intrinsic Amplitude: 26.25 mV
Lead Channel Sensing Intrinsic Amplitude: 3 mV
Lead Channel Setting Pacing Amplitude: 2 V
Lead Channel Setting Pacing Amplitude: 2 V
Lead Channel Setting Pacing Pulse Width: 0.4 ms
Lead Channel Setting Sensing Sensitivity: 1.2 mV
Zone Setting Status: 755011

## 2023-09-05 NOTE — Patient Instructions (Signed)
Medication Instructions:  Your physician recommends that you continue on your current medications as directed. Please refer to the Current Medication list given to you today.  *If you need a refill on your cardiac medications before your next appointment, please call your pharmacy*   Lab Work: None ordered   Testing/Procedures: None ordered   Follow-Up: At [facility], you and your health needs are our priority.  As part of our continuing mission to provide you with exceptional heart care, we have created designated Provider Care Teams.  These Care Teams include your primary Cardiologist (physician) and Advanced Practice Providers (APPs -  Physician Assistants and Nurse Practitioners) who all work together to provide you with the care you need, when you need it.  Remote monitoring is used to monitor your Pacemaker or ICD from home. This monitoring reduces the number of office visits required to check your device to one time per year. It allows [place] to keep an eye on the functioning of your device to ensure it is working properly. You are scheduled for a device check from home on 09/09/2023. You may send your transmission at any time that day. If you have a wireless device, the transmission will be sent automatically. After your physician reviews your transmission, you will receive a postcard with your next transmission date.  Your next appointment:   1 year(s)  The format for your next appointment:   In Person  Provider:   Loman Brooklyn, MD    Thank you for choosing [facility]!!   Dory Horn, RN [phone]

## 2023-09-05 NOTE — Progress Notes (Signed)
  Electrophysiology Office Note:   Date:  09/05/2023  ID:  Jason Escobar, DOB [birth year], MRN [id number]  Primary Cardiologist: None Primary Heart Failure: None Electrophysiologist: Flynt Breeze Jorja Loa, MD      History of Present Illness:   Jason Escobar is a 73 y.o. male with h/o hypertension, hyperlipidemia, second degree heart block seen today for routine electrophysiology followup.   Since last being seen in our clinic the patient reports doing well.  He has had no chest pain or shortness of breath.  He feels much improved since his pacemaker was implanted.  He is able to do his daily activities without restriction..  he denies chest pain, palpitations, dyspnea, PND, orthopnea, nausea, vomiting, dizziness, syncope, edema, weight gain, or early satiety.   Review of systems complete and found to be negative unless listed in HPI.      EP Information / Studies Reviewed:    EKG is ordered today. Personal review as below.      PPM Interrogation-  reviewed in detail today,  See PACEART report.  Device History: Medtronic Dual Chamber PPM implanted 06/02/23 for Second Degree AV block  Risk Assessment/Calculations:              Physical Exam:   VS:  BP 128/86   Pulse 71   Ht 6' 1.6" (1.869 m)   Wt 255 lb (115.7 kg)   SpO2 92%   BMI 33.10 kg/m    Wt Readings from Last 3 Encounters:  09/05/23 255 lb (115.7 kg)  06/01/23 248 lb 1.6 oz (112.5 kg)  02/25/21 250 lb (113.4 kg)     GEN: Well nourished, well developed in no acute distress NECK: No JVD; No carotid bruits CARDIAC: Regular rate and rhythm, no murmurs, rubs, gallops RESPIRATORY:  Clear to auscultation without rales, wheezing or rhonchi  ABDOMEN: Soft, non-tender, non-distended EXTREMITIES:  No edema; No deformity   ASSESSMENT AND PLAN:    Second Degree AV block s/p Medtronic PPM  Normal PPM function See Pace Art report No changes today  2.  Hypertension:well controlled  Disposition:   Follow up with Dr.  Elberta Fortis in 12 months  Signed, Natania Finigan Jorja Loa, MD

## 2023-09-09 ENCOUNTER — Ambulatory Visit: Payer: Medicare Other

## 2023-09-09 DIAGNOSIS — I442 Atrioventricular block, complete: Secondary | ICD-10-CM | POA: Diagnosis not present

## 2023-09-12 LAB — CUP PACEART REMOTE DEVICE CHECK
Battery Remaining Longevity: 153 mo
Battery Voltage: 3.19 V
Brady Statistic AP VP Percent: 19.94 %
Brady Statistic AP VS Percent: 0 %
Brady Statistic AS VP Percent: 80.04 %
Brady Statistic AS VS Percent: 0.02 %
Brady Statistic RA Percent Paced: 19.91 %
Brady Statistic RV Percent Paced: 99.98 %
Date Time Interrogation Session: 20250131005143
Implantable Lead Connection Status: 753985
Implantable Lead Connection Status: 753985
Implantable Lead Implant Date: 20241024
Implantable Lead Implant Date: 20241024
Implantable Lead Location: 753859
Implantable Lead Location: 753860
Implantable Lead Model: 3830
Implantable Lead Model: 5076
Implantable Pulse Generator Implant Date: 20241024
Lead Channel Impedance Value: 285 Ohm
Lead Channel Impedance Value: 437 Ohm
Lead Channel Impedance Value: 475 Ohm
Lead Channel Impedance Value: 608 Ohm
Lead Channel Pacing Threshold Amplitude: 0.875 V
Lead Channel Pacing Threshold Amplitude: 1 V
Lead Channel Pacing Threshold Pulse Width: 0.4 ms
Lead Channel Pacing Threshold Pulse Width: 0.4 ms
Lead Channel Sensing Intrinsic Amplitude: 2.375 mV
Lead Channel Sensing Intrinsic Amplitude: 2.375 mV
Lead Channel Sensing Intrinsic Amplitude: 26.25 mV
Lead Channel Setting Pacing Amplitude: 1.5 V
Lead Channel Setting Pacing Amplitude: 2 V
Lead Channel Setting Pacing Pulse Width: 0.4 ms
Lead Channel Setting Sensing Sensitivity: 1.2 mV
Zone Setting Status: 755011

## 2023-10-14 NOTE — Addendum Note (Signed)
 Addended by: Elease Etienne A on: 10/14/2023 12:49 PM   Modules accepted: Orders

## 2023-10-14 NOTE — Progress Notes (Signed)
 Remote pacemaker transmission.

## 2023-12-09 ENCOUNTER — Ambulatory Visit (INDEPENDENT_AMBULATORY_CARE_PROVIDER_SITE_OTHER): Payer: Medicare Other

## 2023-12-09 DIAGNOSIS — I442 Atrioventricular block, complete: Secondary | ICD-10-CM | POA: Diagnosis not present

## 2023-12-09 LAB — CUP PACEART REMOTE DEVICE CHECK
Battery Remaining Longevity: 150 mo
Battery Voltage: 3.15 V
Brady Statistic AP VP Percent: 25.98 %
Brady Statistic AP VS Percent: 0 %
Brady Statistic AS VP Percent: 73.99 %
Brady Statistic AS VS Percent: 0.02 %
Brady Statistic RA Percent Paced: 25.96 %
Brady Statistic RV Percent Paced: 99.98 %
Date Time Interrogation Session: 20250502065347
Implantable Lead Connection Status: 753985
Implantable Lead Connection Status: 753985
Implantable Lead Implant Date: 20241024
Implantable Lead Implant Date: 20241024
Implantable Lead Location: 753859
Implantable Lead Location: 753860
Implantable Lead Model: 3830
Implantable Lead Model: 5076
Implantable Pulse Generator Implant Date: 20241024
Lead Channel Impedance Value: 304 Ohm
Lead Channel Impedance Value: 399 Ohm
Lead Channel Impedance Value: 456 Ohm
Lead Channel Impedance Value: 646 Ohm
Lead Channel Pacing Threshold Amplitude: 0.75 V
Lead Channel Pacing Threshold Amplitude: 1 V
Lead Channel Pacing Threshold Pulse Width: 0.4 ms
Lead Channel Pacing Threshold Pulse Width: 0.4 ms
Lead Channel Sensing Intrinsic Amplitude: 2.625 mV
Lead Channel Sensing Intrinsic Amplitude: 2.625 mV
Lead Channel Sensing Intrinsic Amplitude: 20.125 mV
Lead Channel Sensing Intrinsic Amplitude: 20.125 mV
Lead Channel Setting Pacing Amplitude: 1.5 V
Lead Channel Setting Pacing Amplitude: 2 V
Lead Channel Setting Pacing Pulse Width: 0.4 ms
Lead Channel Setting Sensing Sensitivity: 1.2 mV
Zone Setting Status: 755011

## 2024-01-18 NOTE — Progress Notes (Signed)
 Remote pacemaker transmission.

## 2024-03-09 ENCOUNTER — Ambulatory Visit: Payer: Medicare Other

## 2024-03-09 DIAGNOSIS — I442 Atrioventricular block, complete: Secondary | ICD-10-CM

## 2024-03-09 LAB — CUP PACEART REMOTE DEVICE CHECK
Battery Remaining Longevity: 146 mo
Battery Voltage: 3.11 V
Brady Statistic AP VP Percent: 29.02 %
Brady Statistic AP VS Percent: 0.03 %
Brady Statistic AS VP Percent: 70.88 %
Brady Statistic AS VS Percent: 0.07 %
Brady Statistic RA Percent Paced: 29.03 %
Brady Statistic RV Percent Paced: 99.9 %
Date Time Interrogation Session: 20250731221622
Implantable Lead Connection Status: 753985
Implantable Lead Connection Status: 753985
Implantable Lead Implant Date: 20241024
Implantable Lead Implant Date: 20241024
Implantable Lead Location: 753859
Implantable Lead Location: 753860
Implantable Lead Model: 3830
Implantable Lead Model: 5076
Implantable Pulse Generator Implant Date: 20241024
Lead Channel Impedance Value: 285 Ohm
Lead Channel Impedance Value: 361 Ohm
Lead Channel Impedance Value: 456 Ohm
Lead Channel Impedance Value: 646 Ohm
Lead Channel Pacing Threshold Amplitude: 0.625 V
Lead Channel Pacing Threshold Amplitude: 1 V
Lead Channel Pacing Threshold Pulse Width: 0.4 ms
Lead Channel Pacing Threshold Pulse Width: 0.4 ms
Lead Channel Sensing Intrinsic Amplitude: 2.75 mV
Lead Channel Sensing Intrinsic Amplitude: 2.75 mV
Lead Channel Sensing Intrinsic Amplitude: 24.625 mV
Lead Channel Sensing Intrinsic Amplitude: 24.625 mV
Lead Channel Setting Pacing Amplitude: 1.5 V
Lead Channel Setting Pacing Amplitude: 2 V
Lead Channel Setting Pacing Pulse Width: 0.4 ms
Lead Channel Setting Sensing Sensitivity: 1.2 mV
Zone Setting Status: 755011

## 2024-03-10 ENCOUNTER — Ambulatory Visit: Payer: Self-pay | Admitting: Cardiology

## 2024-05-07 NOTE — Progress Notes (Signed)
 Remote PPM Transmission

## 2024-06-08 ENCOUNTER — Ambulatory Visit (INDEPENDENT_AMBULATORY_CARE_PROVIDER_SITE_OTHER): Payer: Medicare Other

## 2024-06-08 DIAGNOSIS — I442 Atrioventricular block, complete: Secondary | ICD-10-CM

## 2024-06-09 LAB — CUP PACEART REMOTE DEVICE CHECK
Battery Remaining Longevity: 142 mo
Battery Voltage: 3.06 V
Brady Statistic AP VP Percent: 26.63 %
Brady Statistic AP VS Percent: 0 %
Brady Statistic AS VP Percent: 73.3 %
Brady Statistic AS VS Percent: 0.06 %
Brady Statistic RA Percent Paced: 26.62 %
Brady Statistic RV Percent Paced: 99.93 %
Date Time Interrogation Session: 20251030231458
Implantable Lead Connection Status: 753985
Implantable Lead Connection Status: 753985
Implantable Lead Implant Date: 20241024
Implantable Lead Implant Date: 20241024
Implantable Lead Location: 753859
Implantable Lead Location: 753860
Implantable Lead Model: 3830
Implantable Lead Model: 5076
Implantable Pulse Generator Implant Date: 20241024
Lead Channel Impedance Value: 266 Ohm
Lead Channel Impedance Value: 304 Ohm
Lead Channel Impedance Value: 456 Ohm
Lead Channel Impedance Value: 627 Ohm
Lead Channel Pacing Threshold Amplitude: 0.75 V
Lead Channel Pacing Threshold Amplitude: 0.875 V
Lead Channel Pacing Threshold Pulse Width: 0.4 ms
Lead Channel Pacing Threshold Pulse Width: 0.4 ms
Lead Channel Sensing Intrinsic Amplitude: 28.875 mV
Lead Channel Sensing Intrinsic Amplitude: 28.875 mV
Lead Channel Sensing Intrinsic Amplitude: 3 mV
Lead Channel Sensing Intrinsic Amplitude: 3 mV
Lead Channel Setting Pacing Amplitude: 1.5 V
Lead Channel Setting Pacing Amplitude: 2 V
Lead Channel Setting Pacing Pulse Width: 0.4 ms
Lead Channel Setting Sensing Sensitivity: 1.2 mV
Zone Setting Status: 755011

## 2024-06-10 ENCOUNTER — Ambulatory Visit: Payer: Self-pay | Admitting: Cardiology

## 2024-06-13 NOTE — Progress Notes (Signed)
 Remote PPM Transmission

## 2024-09-07 ENCOUNTER — Ambulatory Visit: Payer: Medicare Other

## 2024-09-07 DIAGNOSIS — I442 Atrioventricular block, complete: Secondary | ICD-10-CM

## 2024-09-10 ENCOUNTER — Ambulatory Visit: Admitting: Cardiology

## 2024-09-10 ENCOUNTER — Encounter: Payer: Self-pay | Admitting: Cardiology

## 2024-09-10 ENCOUNTER — Ambulatory Visit: Payer: Self-pay | Admitting: Cardiology

## 2024-09-10 VITALS — BP 126/74 | HR 81 | Ht 73.5 in | Wt 251.6 lb

## 2024-09-10 DIAGNOSIS — I10 Essential (primary) hypertension: Secondary | ICD-10-CM

## 2024-09-10 DIAGNOSIS — I441 Atrioventricular block, second degree: Secondary | ICD-10-CM

## 2024-09-10 LAB — CUP PACEART INCLINIC DEVICE CHECK
Battery Remaining Longevity: 139 mo
Battery Voltage: 3.04 V
Brady Statistic AP VP Percent: 26.72 %
Brady Statistic AP VS Percent: 0.01 %
Brady Statistic AS VP Percent: 73.22 %
Brady Statistic AS VS Percent: 0.05 %
Brady Statistic RA Percent Paced: 26.71 %
Brady Statistic RV Percent Paced: 99.94 %
Date Time Interrogation Session: 20260202144006
Implantable Lead Connection Status: 753985
Implantable Lead Connection Status: 753985
Implantable Lead Implant Date: 20241024
Implantable Lead Implant Date: 20241024
Implantable Lead Location: 753859
Implantable Lead Location: 753860
Implantable Lead Model: 3830
Implantable Lead Model: 5076
Implantable Pulse Generator Implant Date: 20241024
Lead Channel Impedance Value: 304 Ohm
Lead Channel Impedance Value: 342 Ohm
Lead Channel Impedance Value: 475 Ohm
Lead Channel Impedance Value: 627 Ohm
Lead Channel Pacing Threshold Amplitude: 0.625 V
Lead Channel Pacing Threshold Amplitude: 1 V
Lead Channel Pacing Threshold Pulse Width: 0.4 ms
Lead Channel Pacing Threshold Pulse Width: 0.4 ms
Lead Channel Sensing Intrinsic Amplitude: 2.875 mV
Lead Channel Sensing Intrinsic Amplitude: 27.875 mV
Lead Channel Sensing Intrinsic Amplitude: 31.625 mV
Lead Channel Sensing Intrinsic Amplitude: 4.625 mV
Lead Channel Setting Pacing Amplitude: 1.5 V
Lead Channel Setting Pacing Amplitude: 2 V
Lead Channel Setting Pacing Pulse Width: 0.4 ms
Lead Channel Setting Sensing Sensitivity: 1.2 mV
Zone Setting Status: 755011

## 2024-09-10 LAB — CUP PACEART REMOTE DEVICE CHECK
Battery Remaining Longevity: 139 mo
Battery Voltage: 3.04 V
Brady Statistic AP VP Percent: 25.53 %
Brady Statistic AP VS Percent: 0 %
Brady Statistic AS VP Percent: 74.41 %
Brady Statistic AS VS Percent: 0.05 %
Brady Statistic RA Percent Paced: 25.52 %
Brady Statistic RV Percent Paced: 99.94 %
Date Time Interrogation Session: 20260129222252
Implantable Lead Connection Status: 753985
Implantable Lead Connection Status: 753985
Implantable Lead Implant Date: 20241024
Implantable Lead Implant Date: 20241024
Implantable Lead Location: 753859
Implantable Lead Location: 753860
Implantable Lead Model: 3830
Implantable Lead Model: 5076
Implantable Pulse Generator Implant Date: 20241024
Lead Channel Impedance Value: 285 Ohm
Lead Channel Impedance Value: 342 Ohm
Lead Channel Impedance Value: 437 Ohm
Lead Channel Impedance Value: 627 Ohm
Lead Channel Pacing Threshold Amplitude: 0.75 V
Lead Channel Pacing Threshold Amplitude: 1 V
Lead Channel Pacing Threshold Pulse Width: 0.4 ms
Lead Channel Pacing Threshold Pulse Width: 0.4 ms
Lead Channel Sensing Intrinsic Amplitude: 2.875 mV
Lead Channel Sensing Intrinsic Amplitude: 2.875 mV
Lead Channel Sensing Intrinsic Amplitude: 31.625 mV
Lead Channel Sensing Intrinsic Amplitude: 31.625 mV
Lead Channel Setting Pacing Amplitude: 1.5 V
Lead Channel Setting Pacing Amplitude: 2 V
Lead Channel Setting Pacing Pulse Width: 0.4 ms
Lead Channel Setting Sensing Sensitivity: 1.2 mV
Zone Setting Status: 755011

## 2024-09-14 NOTE — Progress Notes (Signed)
 Remote PPM Transmission
